# Patient Record
Sex: Female | Born: 1950 | Race: White | Hispanic: No | Smoking: Former smoker
Health system: Southern US, Community
[De-identification: ages and names within clinical notes are randomized; demographics above are authoritative.]

## PROBLEM LIST (undated history)

## (undated) DIAGNOSIS — K635 Polyp of colon: Secondary | ICD-10-CM

## (undated) DIAGNOSIS — D122 Benign neoplasm of ascending colon: Secondary | ICD-10-CM

## (undated) DIAGNOSIS — Z860100 Personal history of colon polyps, unspecified: Secondary | ICD-10-CM

## (undated) DIAGNOSIS — F419 Anxiety disorder, unspecified: Secondary | ICD-10-CM

## (undated) DIAGNOSIS — I10 Essential (primary) hypertension: Secondary | ICD-10-CM

## (undated) DIAGNOSIS — E559 Vitamin D deficiency, unspecified: Secondary | ICD-10-CM

## (undated) DIAGNOSIS — I8290 Acute embolism and thrombosis of unspecified vein: Secondary | ICD-10-CM

## (undated) DIAGNOSIS — E079 Disorder of thyroid, unspecified: Secondary | ICD-10-CM

## (undated) DIAGNOSIS — D120 Benign neoplasm of cecum: Secondary | ICD-10-CM

## (undated) DIAGNOSIS — D123 Benign neoplasm of transverse colon: Secondary | ICD-10-CM

## (undated) DIAGNOSIS — E119 Type 2 diabetes mellitus without complications: Secondary | ICD-10-CM

## (undated) DIAGNOSIS — J449 Chronic obstructive pulmonary disease, unspecified: Secondary | ICD-10-CM

## (undated) HISTORY — DX: Anxiety disorder, unspecified: F41.9

## (undated) HISTORY — PX: PHARYNGEAL DILITATION: SHX2230

## (undated) HISTORY — DX: Essential (primary) hypertension: I10

## (undated) HISTORY — DX: Disorder of thyroid, unspecified: E07.9

## (undated) HISTORY — DX: Benign neoplasm of cecum: D12.0

## (undated) HISTORY — DX: Acute embolism and thrombosis of unspecified vein: I82.90

## (undated) HISTORY — DX: Polyp of colon: K63.5

## (undated) HISTORY — DX: Vitamin D deficiency, unspecified: E55.9

## (undated) HISTORY — DX: Personal history of colon polyps, unspecified: Z86.0100

## (undated) HISTORY — DX: Benign neoplasm of transverse colon: D12.3

## (undated) HISTORY — PX: PORTACATH PLACEMENT: SHX2246

## (undated) HISTORY — DX: Benign neoplasm of ascending colon: D12.2

## (undated) HISTORY — PX: TUMOR REMOVAL: SHX12

## (undated) HISTORY — DX: Type 2 diabetes mellitus without complications: E11.9

## (undated) HISTORY — PX: INSERT / REPLACE / REMOVE PACEMAKER: SUR710

## (undated) HISTORY — DX: Personal history of colonic polyps: Z86.010

## (undated) HISTORY — PX: PORT-A-CATH REMOVAL: SHX5289

## (undated) HISTORY — PX: THYROIDECTOMY: SHX17

## (undated) HISTORY — PX: ABDOMINAL HYSTERECTOMY: SHX81

## (undated) HISTORY — PX: ESOPHAGOSCOPY WITH DILITATION: SHX5618

---

## 1988-01-29 HISTORY — PX: SPINE SURGERY: SHX786

## 1992-01-29 HISTORY — PX: PACEMAKER INSERTION: SHX728

## 2003-05-04 ENCOUNTER — Other Ambulatory Visit: Payer: Self-pay

## 2003-09-13 ENCOUNTER — Other Ambulatory Visit: Payer: Self-pay

## 2003-11-23 ENCOUNTER — Ambulatory Visit: Payer: Self-pay | Admitting: Otolaryngology

## 2005-07-04 ENCOUNTER — Ambulatory Visit: Payer: Self-pay | Admitting: Otolaryngology

## 2005-07-19 ENCOUNTER — Ambulatory Visit: Payer: Self-pay | Admitting: Otolaryngology

## 2005-07-24 ENCOUNTER — Ambulatory Visit: Payer: Self-pay | Admitting: Otolaryngology

## 2006-07-04 ENCOUNTER — Ambulatory Visit: Payer: Self-pay | Admitting: Otolaryngology

## 2006-07-09 ENCOUNTER — Ambulatory Visit: Payer: Self-pay | Admitting: Otolaryngology

## 2006-07-09 ENCOUNTER — Other Ambulatory Visit: Payer: Self-pay

## 2006-07-16 ENCOUNTER — Ambulatory Visit: Payer: Self-pay | Admitting: Otolaryngology

## 2009-02-03 ENCOUNTER — Ambulatory Visit: Payer: Self-pay | Admitting: Otolaryngology

## 2009-02-20 ENCOUNTER — Ambulatory Visit: Payer: Self-pay | Admitting: Otolaryngology

## 2009-06-06 ENCOUNTER — Ambulatory Visit: Payer: Self-pay | Admitting: Otolaryngology

## 2009-06-21 ENCOUNTER — Ambulatory Visit: Payer: Self-pay | Admitting: Otolaryngology

## 2009-06-27 ENCOUNTER — Inpatient Hospital Stay: Payer: Self-pay | Admitting: Otolaryngology

## 2010-09-03 ENCOUNTER — Emergency Department: Payer: Self-pay | Admitting: *Deleted

## 2010-10-11 ENCOUNTER — Ambulatory Visit: Payer: Self-pay | Admitting: Otolaryngology

## 2010-10-15 ENCOUNTER — Ambulatory Visit: Payer: Self-pay | Admitting: Otolaryngology

## 2011-09-05 ENCOUNTER — Ambulatory Visit: Payer: Self-pay | Admitting: Otolaryngology

## 2011-09-05 LAB — PROTIME-INR: Prothrombin Time: 20.4 secs — ABNORMAL HIGH (ref 11.5–14.7)

## 2011-09-16 ENCOUNTER — Ambulatory Visit: Payer: Self-pay | Admitting: Otolaryngology

## 2011-09-16 LAB — BASIC METABOLIC PANEL
Anion Gap: 5 — ABNORMAL LOW (ref 7–16)
Calcium, Total: 8.7 mg/dL (ref 8.5–10.1)
Chloride: 104 mmol/L (ref 98–107)
Co2: 32 mmol/L (ref 21–32)
Creatinine: 0.91 mg/dL (ref 0.60–1.30)
EGFR (African American): >60
Glucose: 116 mg/dL — ABNORMAL HIGH (ref 65–99)

## 2012-01-29 HISTORY — PX: TRACHEOSTOMY: SUR1362

## 2012-06-23 ENCOUNTER — Ambulatory Visit: Payer: Self-pay | Admitting: Otolaryngology

## 2012-06-23 LAB — BASIC METABOLIC PANEL
Anion Gap: 3 — ABNORMAL LOW (ref 7–16)
Calcium, Total: 9.3 mg/dL (ref 8.5–10.1)
Chloride: 103 mmol/L (ref 98–107)
Co2: 31 mmol/L (ref 21–32)
Creatinine: 0.91 mg/dL (ref 0.60–1.30)
EGFR (Non-African Amer.): >60
Osmolality: 279 (ref 275–301)
Potassium: 4 mmol/L (ref 3.5–5.1)

## 2012-06-23 LAB — CBC
HCT: 34.1 % — ABNORMAL LOW (ref 35.0–47.0)
MCH: 26 pg (ref 26.0–34.0)
MCHC: 32.9 g/dL (ref 32.0–36.0)
MCV: 79 fL — ABNORMAL LOW (ref 80–100)
RBC: 4.31 10*6/uL (ref 3.80–5.20)
RDW: 16.5 % — ABNORMAL HIGH (ref 11.5–14.5)

## 2012-06-23 LAB — PROTIME-INR: INR: 1.3

## 2012-06-29 ENCOUNTER — Ambulatory Visit: Payer: Self-pay | Admitting: Otolaryngology

## 2013-01-28 HISTORY — PX: COLONOSCOPY: SHX174

## 2013-11-29 ENCOUNTER — Ambulatory Visit: Payer: Self-pay | Admitting: Otolaryngology

## 2013-11-29 DIAGNOSIS — I10 Essential (primary) hypertension: Secondary | ICD-10-CM

## 2013-11-29 LAB — BASIC METABOLIC PANEL
Anion Gap: 11 (ref 7–16)
BUN: 15 mg/dL (ref 7–18)
CHLORIDE: 102 mmol/L (ref 98–107)
CREATININE: 0.95 mg/dL (ref 0.60–1.30)
Calcium, Total: 8.6 mg/dL (ref 8.5–10.1)
Co2: 27 mmol/L (ref 21–32)
EGFR (African American): >60
Glucose: 320 mg/dL — ABNORMAL HIGH (ref 65–99)
Osmolality: 293 (ref 275–301)
POTASSIUM: 3.7 mmol/L (ref 3.5–5.1)
Sodium: 140 mmol/L (ref 136–145)

## 2013-11-29 LAB — CBC WITH DIFFERENTIAL/PLATELET
BASOS PCT: 0.7 %
Basophil #: 0.1 10*3/uL (ref 0.0–0.1)
EOS ABS: 0.4 10*3/uL (ref 0.0–0.7)
Eosinophil %: 4.1 %
HCT: 41.9 % (ref 35.0–47.0)
HGB: 13.7 g/dL (ref 12.0–16.0)
LYMPHS ABS: 3.7 10*3/uL — AB (ref 1.0–3.6)
Lymphocyte %: 38.8 %
MCH: 28.8 pg (ref 26.0–34.0)
MCHC: 32.8 g/dL (ref 32.0–36.0)
MCV: 88 fL (ref 80–100)
MONO ABS: 0.8 x10 3/mm (ref 0.2–0.9)
MONOS PCT: 8.7 %
NEUTROS ABS: 4.5 10*3/uL (ref 1.4–6.5)
Neutrophil %: 47.7 %
Platelet: 208 10*3/uL (ref 150–440)
RBC: 4.77 10*6/uL (ref 3.80–5.20)
RDW: 16.8 % — ABNORMAL HIGH (ref 11.5–14.5)
WBC: 9.5 10*3/uL (ref 3.6–11.0)

## 2013-12-13 ENCOUNTER — Ambulatory Visit: Payer: Self-pay | Admitting: Otolaryngology

## 2014-05-17 NOTE — Op Note (Signed)
PATIENT NAME:  Jessica Fisher, CROSWELL MR#:  [id number] DATE OF BIRTH:  1950/04/24  DATE OF PROCEDURE:  09/16/2011  PREOPERATIVE DIAGNOSIS: Esophageal stricture.   POSTOPERATIVE DIAGNOSIS: Esophageal stricture.   PROCEDURE: Esophagoscopy and esophageal dilation.   SURGEON: Margaretha Sheffield, M.D.   ANESTHESIA: General.   COMPLICATIONS: None.   TOTAL ESTIMATED BLOOD LOSS: None.   DESCRIPTION OF PROCEDURE: The patient was given general anesthesia by oral endotracheal intubation. An upper tooth guard was placed. A pediatric esophagoscope was used for visualization of the cervical esophagus. It was pretty tight right at the opening of the cervical esophagus. I was able to slowly push the pediatric scope through. Mucosa here is healthy. The more distal esophagus beyond this has healthy mucosa as well. There is no sign of any growths or lesions here; it is just very tight at the cervical esophagus. The scope was passed down further to show the rest of the esophagus. I could not get it all the way to the GE junction because of her size and positioning. The scope was removed and no further lesions or problems were noted other than it being tight at the cervical esophagus.   Bougie dilators were used starting with a 26 [place]. This passed easily into the esophagus after removing the pediatric scope. I went up by size and dilated her to 40. At this point it was getting tighter. I was able to dilate her all the way to 70 [place]. There was no bleeding noted at all on the dilators. The patient tolerated the procedure well. She was awakened and taken to the recovery room in satisfactory condition. There were no operative complications.  ____________________________ Huey Romans, MD phj:bjt D:  09/16/2011 08:03:42 ET         T: 09/16/2011 09:45:05 ET        JOB#: [id number] Huey Romans MD ELECTRONICALLY SIGNED 09/19/2011 8:28

## 2014-05-20 NOTE — Op Note (Signed)
PATIENT NAME:  NGINA, ROYER MR#:  [id number] DATE OF BIRTH:  October 14, 1950  DATE OF PROCEDURE:  06/29/2012  PREOPERATIVE DIAGNOSIS: Esophageal stricture.  POSTOPERATIVE DIAGNOSIS: Esophageal stricture.   OPERATIVE PROCEDURE: Esophagoscopy and esophageal dilation.   SURGEON: Huey Romans, M.D.   ANESTHESIA: General.   COMPLICATIONS: None.   DESCRIPTION OF PROCEDURE: The patient was given general anesthesia by oral endotracheal intubation. Once she was fully asleep and relaxed, a pediatric esophagoscope was used to visualize the hypopharynx area. The scope was placed into the posterior pharynx behind the larynx area. There was very small opening into the esophagus. I could but just barely slide the pediatric scope through into the upper esophagus. There was no sign of granulation tissue or masses here and the mucosa appeared to be healthy. The scope was removed and a Maloney Mercury bougie esophageal dilator was used for dilating the esophagus. I started with a 30-French as this was about the diameter of the pediatric esophagoscope. These were tapered bougies and a 30-French was placed without difficulty. I then went up 34, 38 and 42. It started becoming much tighter here at 42 and so we went up slowly and went to 44, 46, 48, 50, 52, 54, 56, 58 and then 60. There was no blood noted throughout. It got a little bit tighter as we went. I would put the bougie in and let it sit for 30 seconds or so to make sure it had stretched the muscle. Once I completed this, I then used a rigid adult esophagoscope and was able to get into the esophagus. There was no sign of any tears. This was much more open and is not nearly as tight as what it was. The patient tolerated the procedure well. She was awakened and taken to the recovery room in satisfactory condition. There were no operative complications.  ____________________________ Huey Romans, MD phj:aw D: 06/29/2012 08:04:28 ET T: 06/29/2012 09:32:10  ET JOB#: [id number]  cc: Huey Romans, MD, <Dictator> Huey Romans MD ELECTRONICALLY SIGNED 07/06/2012 9:39

## 2014-05-21 NOTE — Op Note (Signed)
PATIENT NAME:  Jessica Fisher, Jessica Fisher MR#:  [id number] DATE OF BIRTH:  [birth year]  DATE OF PROCEDURE:  12/13/2013  PREOPERATIVE DIAGNOSES:  1.  Dysphagia.  2.  Esophageal stricture.   POSTOPERATIVE DIAGNOSES:  1.  Dysphagia.  2.  Esophageal stricture.   PROCEDURE: Esophagoscopy and esophageal dilation.   SURGEON: Margaretha Sheffield, MD  ANESTHESIA: General.   COMPLICATIONS: None.   DESCRIPTION OF PROCEDURE: The patient was given general anesthesia by oral endotracheal intubation. A pediatric esophagoscope was used to visualize the cervical esophagus. It was passed under direct vision. There was a real tight area here and seemed to be almost like a small ridge or stricture on the posterior wall. I was able to finally pass through into the esophagus. Mucosa was very healthy. There was no sign of granulation or ulcers or lesions anywhere. I passed this down to about 30 cm, and that was as far as I could go. There was no mucosal irritation, no sign of any fluid collecting anywhere.   Bougies were then used for dilating the esophagus. I started at 36-French and went all the way up to 60-French. I was able to pass this down to 50 cm and allowed it to sit for awhile, each one in succession. There was no blood return on the dilators at all. The patient tolerated the procedure well. This was done in the operating room. There were no operative complications. She was awakened and taken to the recovery room in satisfactory condition.   ____________________________ Huey Romans, MD phj:sb D: 12/13/2013 07:53:37 ET T: 12/13/2013 08:16:25 ET JOB#: [id number]  cc: Huey Romans, MD, <Dictator> Huey Romans MD ELECTRONICALLY SIGNED 12/13/2013 8:40

## 2014-07-26 ENCOUNTER — Encounter: Payer: Self-pay | Admitting: Podiatry

## 2014-07-26 ENCOUNTER — Ambulatory Visit (INDEPENDENT_AMBULATORY_CARE_PROVIDER_SITE_OTHER): Payer: Medicare Other | Admitting: Podiatry

## 2014-07-26 VITALS — BP 129/83 | HR 86 | Ht 67.0 in | Wt 210.0 lb

## 2014-07-26 DIAGNOSIS — L600 Ingrowing nail: Secondary | ICD-10-CM | POA: Insufficient documentation

## 2014-07-26 DIAGNOSIS — IMO0002 Reserved for concepts with insufficient information to code with codable children: Secondary | ICD-10-CM | POA: Insufficient documentation

## 2014-07-26 DIAGNOSIS — L03039 Cellulitis of unspecified toe: Secondary | ICD-10-CM | POA: Diagnosis not present

## 2014-07-26 DIAGNOSIS — L03012 Cellulitis of left finger: Secondary | ICD-10-CM

## 2014-07-26 HISTORY — DX: Reserved for concepts with insufficient information to code with codable children: IMO0002

## 2014-07-26 HISTORY — DX: Ingrowing nail: L60.0

## 2014-07-26 NOTE — Progress Notes (Signed)
Subjective: 64 year old female presents with painful ingrown nail left great toe medial border for over a year. Has had multiple heart problem and could not keep up with her toe issue. She has a Psychologist, forensic.   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  Neurological ROS: no TIA or stroke symptoms Dermatological ROS: Sensitive skin with inflamation at plantar posterior heel left foot. Flares up off and on.    Objective: Dermatologic: Abnormal sensitive and pink skin plantar posterior left heel. Flares up off and on. Ingrown nail with paronychia left great toe medial border duration of a year.  Vascular: All pedal pulses are palpable. No abnormal edema noted. Positive of erythematous left great toe from ingrown nail.  Orthopedic: No gross deformities noted.  Assessment: Ingrown nail left great toe medial border. Abnormal skin condition left heel.  Plan: Affected toe was anesthetized with total 7ml mixture of 50/50 0.5% Marcaine plain and 1% Xylocaine plain. After left great toe was cleansed with Iodine, affected nail border was reflected with a nail elevator and excised with nail nipper. The wound was dressed with Amerigel ointment dressing. Home care instructions and supply dispensed.  Return in 2 month for permanent procedure.

## 2014-07-26 NOTE — Patient Instructions (Signed)
Left great toe ingrown nail surgery done(temporary). Follow soaking instruction. Return if redness or drainage increase. Otherwise return in 2 month for permanent procedure.

## 2014-07-29 ENCOUNTER — Telehealth: Payer: Self-pay | Admitting: *Deleted

## 2014-07-29 MED ORDER — CEPHALEXIN 500 MG PO CAPS: 500.0000 mg | ORAL_CAPSULE | Freq: Two times a day (BID) | ORAL | Status: DC

## 2014-07-29 NOTE — Telephone Encounter (Signed)
07/29/2014 Dr. Caffie Pinto, Patient called this am and said her foot is swollen and toe, she had ingrown toenail surgery on 07/26/14. Can Dr. Caffie Pinto call her a antibiotic, she thinks she was allergic to something used on her toe??? I ask her about iodine and she says she isn't allergic to that.

## 2014-10-04 ENCOUNTER — Encounter: Payer: Self-pay | Admitting: Podiatry

## 2014-10-04 ENCOUNTER — Ambulatory Visit (INDEPENDENT_AMBULATORY_CARE_PROVIDER_SITE_OTHER): Payer: Medicare Other | Admitting: Podiatry

## 2014-10-04 VITALS — Ht 67.0 in | Wt 210.0 lb

## 2014-10-04 DIAGNOSIS — L600 Ingrowing nail: Secondary | ICD-10-CM

## 2014-10-04 NOTE — Progress Notes (Signed)
Patient came in to have ingrown nail surgery on left great toe. The left great toe is clean and healed well from previous temporary procedure. Advised to wait till the nail grows out before having the permanent procedure done

## 2014-11-21 ENCOUNTER — Telehealth: Payer: Self-pay | Admitting: General Surgery

## 2014-11-21 NOTE — Telephone Encounter (Signed)
Jessica Fisher FROM DR Ernesto Rutherford OFFICE CALLED & WANTED TO KNOW IF YOU WOULD DO  A  PORTA CATH PLACEMENT.HE IS PLANNING TO DO SX ON 11-14 OR 11-21 IF POSSIBLE. IF NOT HE WANTED TO USE HIS OR TIME BECAUSE HE IS GOING TO ADMIT PT AFTERWARDS.HIS SX DAY IS ON Monday'S. HER CARDIOLOGIST(DR Lyanne Co GANDHI IN [place] @ [facility] WANTED THE PORT PLACEMENT. Jessica Fisher IS WORKING ON GETTING ORDER. WILL THIS WORK FOR YOU? DO YOU HAVE ANOTHER DAY IN MIND? NOT ABLE TO ASSIST?

## 2014-11-21 NOTE — Telephone Encounter (Signed)
We can work on doing when Dr. Elenore Paddy is in the OR, but will need to see patient and have records to explain why it is being requested.

## 2014-11-30 ENCOUNTER — Encounter: Payer: Self-pay | Admitting: *Deleted

## 2014-12-05 NOTE — Telephone Encounter (Signed)
12-05-14 ABBY WITH DR JUEGEL'S [facility] HAS BEEN WORKING WITH REBCEA & MYSELF ON THIS PT.THE PT IS NOT ABLE TO GET AN ORDER FOR A PORT PLACEMENT FROM EITHER HER CARDIOLOGIST OR HER PCP.WHILE I WAS OUT SICK REBECA MADE PT AN APPT TO COME IN ON Tuesday 12-06-14/[facility]

## 2014-12-06 ENCOUNTER — Other Ambulatory Visit: Payer: Medicare Other

## 2014-12-06 ENCOUNTER — Encounter: Payer: Self-pay | Admitting: General Surgery

## 2014-12-06 ENCOUNTER — Ambulatory Visit (INDEPENDENT_AMBULATORY_CARE_PROVIDER_SITE_OTHER): Payer: Medicare Other | Admitting: General Surgery

## 2014-12-06 VITALS — BP 116/70 | HR 74 | Resp 14 | Ht 67.0 in | Wt 206.0 lb

## 2014-12-06 DIAGNOSIS — E079 Disorder of thyroid, unspecified: Secondary | ICD-10-CM | POA: Diagnosis not present

## 2014-12-06 DIAGNOSIS — I878 Other specified disorders of veins: Secondary | ICD-10-CM | POA: Diagnosis not present

## 2014-12-06 DIAGNOSIS — E039 Hypothyroidism, unspecified: Secondary | ICD-10-CM

## 2014-12-06 NOTE — Patient Instructions (Signed)
Follow up appointment to be announced.  

## 2014-12-06 NOTE — Progress Notes (Signed)
Patient ID: Jessica Fisher, female   DOB: 25-Aug-1950, 64 y.o.   MRN: [id number]  Chief Complaint  Patient presents with  . Other    port placement    HPI Jessica Fisher is a 64 y.o. female. here today for a evaluation of a port placement.  The patient has received most of her care outside this [place]. Her ENT, locally based is planning on an esophageal dilatation. She has had difficulty with intermittent dysphagia dating back to the time of her tracheostomy. One of her other physicians at [facility] has suggested that she have a port placed as she has poor venous access and requires every other month laboratory studies and has had hospitalizations were venous access was a problem.  The patient has remarkably survived life-threatening illnesses including long-term ventilator support. 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 All the records from her past hospitalizations or not available, nor necessary.  A note from her cardiologist clearing her for Port-A-Cath placement but offering no explanation or confirmation of his recommendation that the procedure be completed was reviewed dated 11/25/2014.                                 HPI  Past Medical History  Diagnosis Date  . Diabetes mellitus without complication ([place])   . Anxiety   . Thyroid disease   . Vitamin D deficiency   . Blood clot in vein   . Hypertension     Past Surgical History  Procedure Laterality Date  . Cesarean section    . Abdominal hysterectomy    . Thyroidectomy    . Colonoscopy  2015  . Pharyngeal dilitation    . Portacath placement    . Port-a-cath removal    . Esophagoscopy with dilitation    . Pacemaker insertion  1994  . Tracheostomy  2014  . Tumor removal    . Spine surgery  1990    No family history on file.  Social History Social History  Substance Use Topics  . Smoking status: Former Smoker -- 1.00 packs/day for 26 years    Types: Cigarettes  .  Smokeless tobacco: Never Used  . Alcohol Use: No    Allergies  Allergen Reactions  . Metformin And Related   . Metronidazole   . Pentazocine   . Clarithromycin Itching  . Sulfa Antibiotics Itching    Current Outpatient Prescriptions  Medication Sig Dispense Refill  . ALPRAZolam (XANAX) 1 MG tablet     . canagliflozin (INVOKANA) 300 MG TABS tablet Take 300 mg by mouth.    . cloNIDine (CATAPRES) 0.1 MG tablet Take 0.1 mg by mouth 2 (two) times daily.    . diclofenac (FLECTOR) 1.3 % PTCH     . digoxin (LANOXIN) 0.25 MG tablet Take by mouth.    . diltiazem (CARDIZEM CD) 300 MG 24 hr capsule     . doxepin (SINEQUAN) 10 MG capsule     . fluconazole (DIFLUCAN) 150 MG tablet     . furosemide (LASIX) 40 MG tablet     . HYDROmorphone (DILAUDID) 4 MG tablet     . Insulin Pen Needle (EASY TOUCH PEN NEEDLES) 31G X 8 MM MISC     . insulin regular human CONCENTRATED (HUMULIN R) 500 UNIT/ML injection     . Insulin Syringe-Needle U-100 (BD INSULIN SYRINGE ULTRAFINE) 31G X 5/16" 1 ML  MISC     . levothyroxine (SYNTHROID, LEVOTHROID) 200 MCG tablet Take by mouth.    . Liraglutide (VICTOZA) 18 MG/3ML SOPN     . lisinopril (PRINIVIL,ZESTRIL) 10 MG tablet Take 10 mg by mouth daily.    . metoCLOPramide (REGLAN) 10 MG tablet Take 10 mg by mouth.    . metolazone (ZAROXOLYN) 5 MG tablet Take 5 mg by mouth.    . nitroGLYCERIN (NITROSTAT) 0.4 MG SL tablet     . omeprazole (PRILOSEC) 40 MG capsule Take 40 mg by mouth.    . oxyCODONE-acetaminophen (PERCOCET/ROXICET) 5-325 MG per tablet     . Oxymorphone HCl, Crush Resist, (OPANA ER, CRUSH RESISTANT,) 40 MG T12A     . pregabalin (LYRICA) 300 MG capsule Take 300 mg by mouth.    . QUEtiapine (SEROQUEL) 100 MG tablet     . rivaroxaban (XARELTO) 20 MG TABS tablet     . rosuvastatin (CRESTOR) 10 MG tablet Take 10 mg by mouth.    . solifenacin (VESICARE) 10 MG tablet Take 10 mg by mouth.    . Tiotropium Bromide Monohydrate (SPIRIVA RESPIMAT) 2.5 MCG/ACT AERS      . venlafaxine XR (EFFEXOR-XR) 150 MG 24 hr capsule Take 150 mg by mouth.    . cyclobenzaprine (FLEXERIL) 10 MG tablet     . lubiprostone (AMITIZA) 24 MCG capsule Take by mouth.     No current facility-administered medications for this visit.    Review of Systems Review of Systems  Constitutional: Negative.   Respiratory: Negative.   Cardiovascular: Negative.   Gastrointestinal: Negative.     Blood pressure 116/70, pulse 74, resp. rate 14, height 5\' 7"  (1.702 m), weight 206 lb (93.441 kg).  Physical Exam Physical Exam  Constitutional: She is oriented to person, place, and time. She appears well-developed and well-nourished.  Eyes: Conjunctivae are normal. No scleral icterus.  Neck: Neck supple.  Cardiovascular: Normal rate, regular rhythm and normal heart sounds.   Pulmonary/Chest: Effort normal and breath sounds normal.    Examination of the upper extremity showed no venous dilatation. Veins on the hand were fairly prominent for woman. She reports that they "blow" when used for blood draws or IVs.  Lymphadenopathy:    She has no cervical adenopathy.  Neurological: She is alert and oriented to person, place, and time.  Skin: Skin is warm and dry.    Data Reviewed Cardiology clearance dated 11/30/2014 from Dr. Towanda Malkin reviewed. Should felt to be at low risk.  Multiple laboratory studies from September 2016 reviewed. Drug screen positive for prescribed benzodiazepine. Hydromorphone present. Oxymorphone present. Normal electrolytes, normal liver function studies, low vitamin D, hemoglobin 14.8 with an MCV of 93, white blood cell count 9100.  [facility] 11/16/2014 showed sinus rhythm with left axis deviation. No interval change from October 2016. Artery algae note from Dr. Duke Salvia dated 09/13/2014 reported normal coronary arteries on May 2015 exam. Past history pulmonary emboli, negative chest CT August 2011 and 2013. Previous cardiac monitoring May 2016 did not  show significant arrhythmias. Status post dual-chamber pacemaker placement April 2016.  Venous ultrasound of the upper extremity veins was undertaken in the event that placement of a Port-A-Cath is appropriate. The right subclavian vein is in use with the previously placed pacemaker. The left subclavian vein show significant stenosis likely secondary to the previous port placement. The left internal jugular vein shows clot and is noncompressible. No flow noted. The right internal jugular vein is patent, shows normal respiratory variation  and compressibility.  Assessment    Poor venous access, possible candidate for chronic central venous access.    Plan    This patient has a long complicated medical history. It is uncommon to require a port placement for central venous access for routine laboratory studies. Much safer would be intermittent sampling from either an artery or the femoral vein. If her internist or cardiologist submittal lead her stating that the risk of central venous access and the addition of another intravascular device in the face of her previous deep venous thrombosis/pulmonary embolus is warranted, this can be placed. With occlusion of the left internal jugular vein should she developed right IJ occlusion she could be at risk for significant increased intracranial venous pressure.  I have not strongly supported placement of catheter, but will defer to her primary care/cardiology physicians as they certainly no this patient better. The internal jugular vein could always be used in an emergency situation, and would avoid the risk of long-term cannulation.  The patient is scheduled to be reevaluated by her cardiologist later this week. A copy of today's note will be forwarded to him for his review. Final decision on port placement will be made after his reassessment.     Over 45 minutes was spent reviewing the patient's extensive medical history and reviewing the pros and cons of  central venous access. PCP:  Dustin Folks 12/07/2014, 6:52 AM

## 2014-12-07 DIAGNOSIS — I878 Other specified disorders of veins: Secondary | ICD-10-CM

## 2014-12-07 DIAGNOSIS — E079 Disorder of thyroid, unspecified: Secondary | ICD-10-CM | POA: Insufficient documentation

## 2014-12-07 HISTORY — DX: Other specified disorders of veins: I87.8

## 2014-12-08 ENCOUNTER — Encounter
Admission: RE | Admit: 2014-12-08 | Discharge: 2014-12-08 | Disposition: A | Discharge status: Home / Self Care | Payer: Medicare Other | Source: Ambulatory Visit | Attending: Otolaryngology | Admitting: Otolaryngology

## 2014-12-08 DIAGNOSIS — Z01818 Encounter for other preprocedural examination: Secondary | ICD-10-CM | POA: Diagnosis present

## 2014-12-08 LAB — SURGICAL PCR SCREEN
MRSA, PCR: POSITIVE — AB
STAPHYLOCOCCUS AUREUS: POSITIVE — AB

## 2014-12-08 NOTE — OR Nursing (Signed)
Faxed notification of MRSA + to Dr. Kathyrn Sheriff and Dr. Dwyane Luo offices

## 2014-12-08 NOTE — Patient Instructions (Signed)
  Your procedure is scheduled on: 12/12/14 Report to Day Surgery. To find out your arrival time please call [phone] between 1PM - 3PM on 12/09/14  Remember: Instructions that are not followed completely may result in serious medical risk, up to and including death, or upon the discretion of your surgeon and anesthesiologist your surgery may need to be rescheduled.    ____ 1. Do not eat food or drink liquids after midnight. No gum chewing or hard candies.     ____ 2. No Alcohol for 24 hours before or after surgery.   ____ 3. Bring all medications with you on the day of surgery if instructed.    ____ 4. Notify your doctor if there is any change in your medical condition     (cold, fever, infections).     Do not wear jewelry, make-up, hairpins, clips or nail polish.  Do not wear lotions, powders, or perfumes. You may wear deodorant.  Do not shave 48 hours prior to surgery. Men may shave face and neck.  Do not bring valuables to the hospital.    [facility] is not responsible for any belongings or valuables.               Contacts, dentures or bridgework may not be worn into surgery.  Leave your suitcase in the car. After surgery it may be brought to your room.  For patients admitted to the hospital, discharge time is determined by your                treatment team.   Patients discharged the day of surgery will not be allowed to drive home.   Please read over the following fact sheets that you were given:   Surgical Site Infection Prevention   __x__ Take these medicines the morning of surgery with A SIP OF WATER:    1. Pain Medicine  2. Alprazolam  3. clonidine  4.lanoxin                Cardizem   5.levothyroxine  6.Lisinopril               omeprazole  ____ Fleet Enema (as directed)   __x__ Use CHG Soap as directed  __x__ Use inhalers on the day of surgery Bring with you to hospital  ____ Stop metformin 2 days prior to surgery    __x__ Take 1/2 of usual insulin  dose the night before surgery and none on the morning of surgery.   __x__ Stop Coumadin/Plavix/aspirin on xerelto stopped on 12/05/14  ___x_ Stop Anti-inflammatories on No patches until after surgery   ____ Stop supplements until after surgery.    ____ Bring C-Pap to the hospital.

## 2014-12-12 ENCOUNTER — Ambulatory Visit: Payer: Medicare Other | Admitting: Certified Registered Nurse Anesthetist

## 2014-12-12 ENCOUNTER — Encounter: Payer: Self-pay | Admitting: Anesthesiology

## 2014-12-12 ENCOUNTER — Ambulatory Visit
Admission: RE | Admit: 2014-12-12 | Discharge: 2014-12-12 | Disposition: A | Discharge status: Home / Self Care | Payer: Medicare Other | Source: Ambulatory Visit | Attending: Otolaryngology | Admitting: Otolaryngology

## 2014-12-12 ENCOUNTER — Encounter
Admission: RE | Disposition: A | Discharge status: Home / Self Care | Payer: Self-pay | Source: Ambulatory Visit | Attending: Otolaryngology

## 2014-12-12 DIAGNOSIS — Z981 Arthrodesis status: Secondary | ICD-10-CM | POA: Diagnosis not present

## 2014-12-12 DIAGNOSIS — J449 Chronic obstructive pulmonary disease, unspecified: Secondary | ICD-10-CM | POA: Insufficient documentation

## 2014-12-12 DIAGNOSIS — K6389 Other specified diseases of intestine: Secondary | ICD-10-CM | POA: Insufficient documentation

## 2014-12-12 DIAGNOSIS — Z9849 Cataract extraction status, unspecified eye: Secondary | ICD-10-CM | POA: Diagnosis not present

## 2014-12-12 DIAGNOSIS — K219 Gastro-esophageal reflux disease without esophagitis: Secondary | ICD-10-CM | POA: Insufficient documentation

## 2014-12-12 DIAGNOSIS — Z888 Allergy status to other drugs, medicaments and biological substances status: Secondary | ICD-10-CM | POA: Insufficient documentation

## 2014-12-12 DIAGNOSIS — Z79891 Long term (current) use of opiate analgesic: Secondary | ICD-10-CM | POA: Insufficient documentation

## 2014-12-12 DIAGNOSIS — Z7901 Long term (current) use of anticoagulants: Secondary | ICD-10-CM | POA: Insufficient documentation

## 2014-12-12 DIAGNOSIS — Z881 Allergy status to other antibiotic agents status: Secondary | ICD-10-CM | POA: Diagnosis not present

## 2014-12-12 DIAGNOSIS — Z8673 Personal history of transient ischemic attack (TIA), and cerebral infarction without residual deficits: Secondary | ICD-10-CM | POA: Diagnosis not present

## 2014-12-12 DIAGNOSIS — Z820 Family history of epilepsy and other diseases of the nervous system: Secondary | ICD-10-CM | POA: Diagnosis not present

## 2014-12-12 DIAGNOSIS — F329 Major depressive disorder, single episode, unspecified: Secondary | ICD-10-CM | POA: Diagnosis not present

## 2014-12-12 DIAGNOSIS — Z950 Presence of cardiac pacemaker: Secondary | ICD-10-CM | POA: Diagnosis not present

## 2014-12-12 DIAGNOSIS — G2581 Restless legs syndrome: Secondary | ICD-10-CM | POA: Insufficient documentation

## 2014-12-12 DIAGNOSIS — Z883 Allergy status to other anti-infective agents status: Secondary | ICD-10-CM | POA: Insufficient documentation

## 2014-12-12 DIAGNOSIS — E669 Obesity, unspecified: Secondary | ICD-10-CM | POA: Diagnosis not present

## 2014-12-12 DIAGNOSIS — K222 Esophageal obstruction: Secondary | ICD-10-CM | POA: Insufficient documentation

## 2014-12-12 DIAGNOSIS — Z91048 Other nonmedicinal substance allergy status: Secondary | ICD-10-CM | POA: Insufficient documentation

## 2014-12-12 DIAGNOSIS — Z87891 Personal history of nicotine dependence: Secondary | ICD-10-CM | POA: Insufficient documentation

## 2014-12-12 DIAGNOSIS — I119 Hypertensive heart disease without heart failure: Secondary | ICD-10-CM | POA: Diagnosis not present

## 2014-12-12 DIAGNOSIS — E890 Postprocedural hypothyroidism: Secondary | ICD-10-CM | POA: Insufficient documentation

## 2014-12-12 DIAGNOSIS — J8410 Pulmonary fibrosis, unspecified: Secondary | ICD-10-CM | POA: Diagnosis not present

## 2014-12-12 DIAGNOSIS — J45909 Unspecified asthma, uncomplicated: Secondary | ICD-10-CM | POA: Insufficient documentation

## 2014-12-12 DIAGNOSIS — E785 Hyperlipidemia, unspecified: Secondary | ICD-10-CM | POA: Diagnosis not present

## 2014-12-12 DIAGNOSIS — Z823 Family history of stroke: Secondary | ICD-10-CM | POA: Diagnosis not present

## 2014-12-12 DIAGNOSIS — Z8249 Family history of ischemic heart disease and other diseases of the circulatory system: Secondary | ICD-10-CM | POA: Insufficient documentation

## 2014-12-12 DIAGNOSIS — I341 Nonrheumatic mitral (valve) prolapse: Secondary | ICD-10-CM | POA: Diagnosis not present

## 2014-12-12 DIAGNOSIS — I4891 Unspecified atrial fibrillation: Secondary | ICD-10-CM | POA: Insufficient documentation

## 2014-12-12 DIAGNOSIS — Z79899 Other long term (current) drug therapy: Secondary | ICD-10-CM | POA: Insufficient documentation

## 2014-12-12 DIAGNOSIS — Z6832 Body mass index (BMI) 32.0-32.9, adult: Secondary | ICD-10-CM | POA: Diagnosis not present

## 2014-12-12 DIAGNOSIS — I2510 Atherosclerotic heart disease of native coronary artery without angina pectoris: Secondary | ICD-10-CM | POA: Diagnosis not present

## 2014-12-12 HISTORY — DX: Chronic obstructive pulmonary disease, unspecified: J44.9

## 2014-12-12 HISTORY — PX: ESOPHAGOSCOPY WITH DILITATION: SHX5618

## 2014-12-12 LAB — GLUCOSE, CAPILLARY
GLUCOSE-CAPILLARY: 173 mg/dL — AB (ref 65–99)
GLUCOSE-CAPILLARY: 127 mg/dL — AB (ref 65–99)

## 2014-12-12 SURGERY — ESOPHAGOSCOPY, WITH DILATION
Anesthesia: General | Site: Throat | Wound class: Clean Contaminated

## 2014-12-12 MED ORDER — LIDOCAINE HCL (CARDIAC) 20 MG/ML IV SOLN
INTRAVENOUS | Status: DC | PRN
  Administered 2014-12-12: 100 mg via INTRAVENOUS

## 2014-12-12 MED ORDER — FENTANYL CITRATE (PF) 100 MCG/2ML IJ SOLN
25.0000 ug | INTRAMUSCULAR | Status: DC | PRN
  Administered 2014-12-12 (×2): 25 ug via INTRAVENOUS

## 2014-12-12 MED ORDER — SUCCINYLCHOLINE CHLORIDE 20 MG/ML IJ SOLN
INTRAMUSCULAR | Status: DC | PRN
  Administered 2014-12-12: 80 mg via INTRAVENOUS

## 2014-12-12 MED ORDER — ROCURONIUM BROMIDE 100 MG/10ML IV SOLN
INTRAVENOUS | Status: DC | PRN
  Administered 2014-12-12: 10 mg via INTRAVENOUS
  Administered 2014-12-12: 5 mg via INTRAVENOUS

## 2014-12-12 MED ORDER — FENTANYL CITRATE (PF) 100 MCG/2ML IJ SOLN
INTRAMUSCULAR | Status: AC
  Filled 2014-12-12: qty 2

## 2014-12-12 MED ORDER — PHENYLEPHRINE HCL 10 % OP SOLN
Freq: Once | OPHTHALMIC | Status: DC
  Filled 2014-12-12: qty 10

## 2014-12-12 MED ORDER — MIDAZOLAM HCL 2 MG/2ML IJ SOLN
INTRAMUSCULAR | Status: DC | PRN
  Administered 2014-12-12: 2 mg via INTRAVENOUS

## 2014-12-12 MED ORDER — PROPOFOL 10 MG/ML IV BOLUS
INTRAVENOUS | Status: DC | PRN
  Administered 2014-12-12: 140 mg via INTRAVENOUS
  Administered 2014-12-12: 50 mg via INTRAVENOUS
  Administered 2014-12-12: 60 mg via INTRAVENOUS

## 2014-12-12 MED ORDER — ONDANSETRON HCL 4 MG/2ML IJ SOLN
INTRAMUSCULAR | Status: DC | PRN
  Administered 2014-12-12: 4 mg via INTRAVENOUS

## 2014-12-12 MED ORDER — PHENYLEPHRINE HCL 10 MG/ML IJ SOLN
INTRAMUSCULAR | Status: DC | PRN
  Administered 2014-12-12: 50 ug via INTRAVENOUS

## 2014-12-12 MED ORDER — FENTANYL CITRATE (PF) 100 MCG/2ML IJ SOLN
INTRAMUSCULAR | Status: DC | PRN
  Administered 2014-12-12 (×4): 50 ug via INTRAVENOUS

## 2014-12-12 MED ORDER — ACETAMINOPHEN 325 MG PO TABS
ORAL_TABLET | ORAL | Status: AC
  Administered 2014-12-12: 650 mg via ORAL
  Filled 2014-12-12: qty 2

## 2014-12-12 MED ORDER — ONDANSETRON HCL 4 MG/2ML IJ SOLN: 4.0000 mg | Freq: Once | INTRAMUSCULAR | Status: DC | PRN

## 2014-12-12 MED ORDER — ACETAMINOPHEN 325 MG PO TABS
650.0000 mg | ORAL_TABLET | Freq: Once | ORAL | Status: AC
  Administered 2014-12-12: 650 mg via ORAL

## 2014-12-12 MED ORDER — SODIUM CHLORIDE 0.9 % IV SOLN
INTRAVENOUS | Status: DC
  Administered 2014-12-12: 50 mL/h via INTRAVENOUS

## 2014-12-12 SURGICAL SUPPLY — 9 items
CANISTER SUCT 1200ML W/VALVE (MISCELLANEOUS) ×3 IMPLANT
CUP MEDICINE 2OZ PLAST GRAD ST (MISCELLANEOUS) ×3 IMPLANT
DRAPE SHEET LG 3/4 BI-LAMINATE (DRAPES) ×3 IMPLANT
DRAPE TABLE BACK 80X90 (DRAPES) ×3 IMPLANT
GLOVE EXAM LX STRL 7.5 (GLOVE) ×3 IMPLANT
SURGILUBE 2OZ TUBE FLIPTOP (MISCELLANEOUS) ×3 IMPLANT
TOWEL OR 17X26 4PK STRL BLUE (TOWEL DISPOSABLE) ×3 IMPLANT
TUBING CONNECTING 10 (TUBING) ×2 IMPLANT
TUBING CONNECTING 10' (TUBING) ×1

## 2014-12-12 NOTE — Transfer of Care (Signed)
Immediate Anesthesia Transfer of Care Note  Patient: Jessica Fisher  Procedure(s) Performed: Procedure(s): ESOPHAGOSCOPY WITH DILITATION (N/A)  Patient Location: PACU  Anesthesia Type:General  Level of Consciousness: awake and alert   Airway & Oxygen Therapy: Patient Spontanous Breathing and Patient connected to face mask oxygen  Post-op Assessment: Report given to RN and Post -op Vital signs reviewed and stable  Post vital signs: Reviewed and stable  Last Vitals:  Filed Vitals:   12/12/14 1119  BP: 145/61  Pulse: 73  Temp: 36.3 C  Resp: 10    Complications: No apparent anesthesia complications

## 2014-12-12 NOTE — Op Note (Signed)
12/12/2014  11:03 AM    Jessica Fisher  RP:[id number]   Pre-Op Dx:  Cervical esophageal stricture  Post-op Dx: Same  Proc: Esophagoscopy and esophageal dilation   Surg:  Shaan Rhoads H  Anes:  GOT  EBL:  None  Comp:  None  Findings:  Tight cervical esophageal sphincter  Procedure: The patient was given general anesthesia by oral endotracheal intubation. He was in a supine position on the table with her neck extended. A tooth guard was used to protect her upper teeth and a pediatric esophagoscope was used for visualization of the cervical esophagus. The subcutaneous esophagus was tight and I was unable to get past this area. There is no evidence of lesions or masses. The mucosa looked healthy.  Maloney dilators were used for opening up the cervical esophagus. Started with a 66 [place] and was able to slide this through. We then slowly increased up to 50 [place]. There is no bleeding noted at all throughout the procedure. I then took a 10 x 14 mm x 50 cm esophagoscope to visualize the area again I could get part way through the cervical or: Esophagus with this but I did not want to push it to create any trauma. I was unable to see the length of the esophagus, but she dilated well up to 22 [place].  She tolerated this well. She was awakened taken to the recovery room in satisfactory condition. There were no operative complications.  Dispo:   To PACU to be discharged home  Plan:  Can start the soft diet and increase diet as tolerated home. Should not need any pain medication. She will call if she has any postop problems, and let me know when she starts having difficulty swallowing again.  Nicolus Ose H  12/12/2014 11:03 AM

## 2014-12-12 NOTE — Anesthesia Postprocedure Evaluation (Signed)
  Anesthesia Post-op Note  Patient: Jessica Fisher  Procedure(s) Performed: Procedure(s): ESOPHAGOSCOPY WITH DILITATION (N/A)  Anesthesia type:General  Patient location: PACU  Post pain: Pain level controlled  Post assessment: Post-op Vital signs reviewed, Patient's Cardiovascular Status Stable, Respiratory Function Stable, Patent Airway and No signs of Nausea or vomiting  Post vital signs: Reviewed and stable  Last Vitals:  Filed Vitals:   12/12/14 1304  BP: 118/64  Pulse: 71  Temp:   Resp: 16    Level of consciousness: awake, alert  and patient cooperative  Complications: No apparent anesthesia complications

## 2014-12-12 NOTE — Anesthesia Preprocedure Evaluation (Addendum)
Anesthesia Evaluation  Patient identified by MRN, date of birth, ID band Patient awake    Reviewed: Allergy & Precautions, NPO status , Patient's Chart, lab work & pertinent test results, reviewed documented beta blocker date and time   Airway Mallampati: II  TM Distance: >3 FB     Dental  (+) Chipped   Pulmonary former smoker,           Cardiovascular hypertension, Pt. on medications + pacemaker      Neuro/Psych Anxiety    GI/Hepatic   Endo/Other  diabetes, Type 2  Renal/GU      Musculoskeletal   Abdominal   Peds  Hematology   Anesthesia Other Findings Dual pacemaker. Hx of trach. Lumbar lam with metal. + MRSA. EKG ok. Does not take NTG anymore. Took it during malfunction of pacemaker. +MRSA.  Reproductive/Obstetrics                           Anesthesia Physical Anesthesia Plan  ASA: III  Anesthesia Plan: General   Post-op Pain Management:    Induction: Intravenous  Airway Management Planned: Oral ETT  Additional Equipment:   Intra-op Plan:   Post-operative Plan:   Informed Consent: I have reviewed the patients History and Physical, chart, labs and discussed the procedure including the risks, benefits and alternatives for the proposed anesthesia with the patient or authorized representative who has indicated his/her understanding and acceptance.     Plan Discussed with: CRNA  Anesthesia Plan Comments:         Anesthesia Quick Evaluation

## 2014-12-12 NOTE — H&P (Signed)
  H&P has been reviewed and no changes necessary. To be downloaded later. 

## 2015-06-30 ENCOUNTER — Encounter: Payer: Self-pay | Admitting: Podiatry

## 2015-06-30 ENCOUNTER — Ambulatory Visit (INDEPENDENT_AMBULATORY_CARE_PROVIDER_SITE_OTHER): Payer: Medicare Other | Admitting: Podiatry

## 2015-06-30 VITALS — BP 181/88 | HR 88

## 2015-06-30 DIAGNOSIS — IMO0002 Reserved for concepts with insufficient information to code with codable children: Secondary | ICD-10-CM

## 2015-06-30 DIAGNOSIS — L600 Ingrowing nail: Secondary | ICD-10-CM | POA: Diagnosis not present

## 2015-06-30 DIAGNOSIS — M79606 Pain in leg, unspecified: Secondary | ICD-10-CM

## 2015-06-30 DIAGNOSIS — L03039 Cellulitis of unspecified toe: Secondary | ICD-10-CM | POA: Diagnosis not present

## 2015-06-30 DIAGNOSIS — M79673 Pain in unspecified foot: Secondary | ICD-10-CM

## 2015-06-30 MED ORDER — AMOXICILLIN-POT CLAVULANATE 875-125 MG PO TABS: 1.0000 | ORAL_TABLET | Freq: Two times a day (BID) | ORAL | Status: DC

## 2015-06-30 NOTE — Patient Instructions (Signed)
Ingrown nail surgery done on both great toes. Follow soaking instructions and take antibiotics as prescribed. Return in one week.

## 2015-06-30 NOTE — Progress Notes (Signed)
Subjective: 65 year old female presents requesting ingrown nail suger to be done on both great toes on both borders. She was at her PCP office and was recommended to have this procedure done since she is at high risk with infection. Patient is wearing O2 tank.  Pain with ingrown nail on both great toe. Last Tuesday was unconscious for 5 hours from high blood sugar.  Patient also request for diabetic shoes.  Objective: Dermatologic:  Ingrown nail on both borders of both great toe symptomatic. Abnormal sensitive and pink skin plantar posterior left heel. Flares up off and on. Ingrown nail with paronychia left great toe medial border duration of a year.  Vascular: All pedal pulses are palpable. No abnormal edema noted. Positive of erythematous left great toe from ingrown nail.  Orthopedic: No gross deformities noted.  Assessment: Ingrown nail both borders of both great toes. Abnormal skin condition left heel, checked out by dermatology for malignancy and was negative.  Plan: Affected both great toes were anesthetized with total 102ml mixture of 50/50 0.5% Marcaine plain and 1% Xylocaine plain. After left great toe was cleansed with Iodine, affected nail border was reflected with a nail elevator and excised with nail nipper. Nail matrix tissues on both borders of both great toes were cauterized with Phenol x 3 and neutralized with alcohol.  Both great toe wounds were dressed with Amerigel ointment dressing. Home care instructions and supply dispensed.  Return in one week.

## 2015-07-05 ENCOUNTER — Encounter: Payer: Self-pay | Admitting: Podiatry

## 2015-07-05 ENCOUNTER — Ambulatory Visit (INDEPENDENT_AMBULATORY_CARE_PROVIDER_SITE_OTHER): Payer: Medicare Other | Admitting: Podiatry

## 2015-07-05 DIAGNOSIS — Z9889 Other specified postprocedural states: Secondary | ICD-10-CM

## 2015-07-05 NOTE — Progress Notes (Signed)
One week status post bilateral ingrown nail surgery both great toe both borders with Phenol and Alcohol.  She has had reaction with local anesthetics that made red streaks at the injection site and made it itch. She took Benedril and is tolerable. Been soaking both feet daily. Both great toe surgical site is clean with minimum drainage. No redness or abnormal issues.  Wound cleansed with Betadine ointment and Amerigel ointment dressing applied.  Continue to soak daily till complete healing. Return as needed.

## 2015-07-05 NOTE — Patient Instructions (Signed)
Good wound healing following ingrown nail surgery on both great toes. Continue soaking till complete healing achieved, and return as needed.

## 2017-03-25 ENCOUNTER — Other Ambulatory Visit: Payer: Self-pay

## 2017-03-25 ENCOUNTER — Emergency Department (HOSPITAL_BASED_OUTPATIENT_CLINIC_OR_DEPARTMENT_OTHER)
Admission: EM | Admit: 2017-03-25 | Discharge: 2017-03-25 | Disposition: A | Discharge status: Home / Self Care | Payer: Medicare Other | Attending: Emergency Medicine | Admitting: Emergency Medicine

## 2017-03-25 ENCOUNTER — Emergency Department (HOSPITAL_BASED_OUTPATIENT_CLINIC_OR_DEPARTMENT_OTHER): Payer: Medicare Other

## 2017-03-25 ENCOUNTER — Encounter (HOSPITAL_BASED_OUTPATIENT_CLINIC_OR_DEPARTMENT_OTHER): Payer: Self-pay | Admitting: Emergency Medicine

## 2017-03-25 DIAGNOSIS — R739 Hyperglycemia, unspecified: Secondary | ICD-10-CM

## 2017-03-25 DIAGNOSIS — E1165 Type 2 diabetes mellitus with hyperglycemia: Secondary | ICD-10-CM | POA: Diagnosis not present

## 2017-03-25 DIAGNOSIS — R079 Chest pain, unspecified: Secondary | ICD-10-CM | POA: Diagnosis present

## 2017-03-25 DIAGNOSIS — E039 Hypothyroidism, unspecified: Secondary | ICD-10-CM | POA: Insufficient documentation

## 2017-03-25 DIAGNOSIS — Z79899 Other long term (current) drug therapy: Secondary | ICD-10-CM | POA: Diagnosis not present

## 2017-03-25 DIAGNOSIS — I10 Essential (primary) hypertension: Secondary | ICD-10-CM | POA: Diagnosis not present

## 2017-03-25 DIAGNOSIS — Z7901 Long term (current) use of anticoagulants: Secondary | ICD-10-CM | POA: Diagnosis not present

## 2017-03-25 DIAGNOSIS — Z87891 Personal history of nicotine dependence: Secondary | ICD-10-CM | POA: Insufficient documentation

## 2017-03-25 DIAGNOSIS — J449 Chronic obstructive pulmonary disease, unspecified: Secondary | ICD-10-CM | POA: Diagnosis not present

## 2017-03-25 DIAGNOSIS — Z794 Long term (current) use of insulin: Secondary | ICD-10-CM | POA: Insufficient documentation

## 2017-03-25 DIAGNOSIS — R0789 Other chest pain: Secondary | ICD-10-CM | POA: Diagnosis not present

## 2017-03-25 LAB — TROPONIN I

## 2017-03-25 LAB — BASIC METABOLIC PANEL
ANION GAP: 11 (ref 5–15)
BUN: 20 mg/dL (ref 6–20)
CO2: 24 mmol/L (ref 22–32)
Calcium: 9.6 mg/dL (ref 8.9–10.3)
Chloride: 95 mmol/L — ABNORMAL LOW (ref 101–111)
Creatinine, Ser: 0.89 mg/dL (ref 0.44–1.00)
GLUCOSE: 413 mg/dL — AB (ref 65–99)
Potassium: 4.8 mmol/L (ref 3.5–5.1)
Sodium: 130 mmol/L — ABNORMAL LOW (ref 135–145)

## 2017-03-25 LAB — CBC
HEMATOCRIT: 46 % (ref 36.0–46.0)
HEMOGLOBIN: 16.1 g/dL — AB (ref 12.0–15.0)
MCH: 30.1 pg (ref 26.0–34.0)
MCHC: 35 g/dL (ref 30.0–36.0)
MCV: 86.1 fL (ref 78.0–100.0)
Platelets: 259 10*3/uL (ref 150–400)
RBC: 5.34 MIL/uL — AB (ref 3.87–5.11)
RDW: 15.1 % (ref 11.5–15.5)
WBC: 9.2 10*3/uL (ref 4.0–10.5)

## 2017-03-25 LAB — CBG MONITORING, ED: Glucose-Capillary: 285 mg/dL — ABNORMAL HIGH (ref 65–99)

## 2017-03-25 MED ORDER — INSULIN ASPART 100 UNIT/ML ~~LOC~~ SOLN
5.0000 [IU] | Freq: Once | SUBCUTANEOUS | Status: AC
  Administered 2017-03-25: 5 [IU] via SUBCUTANEOUS
  Filled 2017-03-25: qty 1

## 2017-03-25 NOTE — ED Triage Notes (Signed)
Patient has not stopped talking the whole time in triage  - the patient is yelling loudly in no note distress. She reports that is anxious and upset about the MD's that have seen her and refused to see her lately

## 2017-03-25 NOTE — ED Notes (Signed)
Patient states that she would like to eat but then subsequently states that she needs to vomit and hasn't eaten all day.  Son states that she drank two meal replacement shakes today.  Informed patient that if she is vomiting she should not eat right now.  Patient states "I can eat a saltine cracker".  Patient instructed to not eat or drink at present until seen by EDP.  Patient yelling when speaking to staff.  Currently with mountain dew at the bedside.

## 2017-03-25 NOTE — ED Notes (Signed)
Patient stated that she used O2 [place] at 2 lpm all the time.  Patient stated that she was diagnosed with COPD and pulmonary fibrosis.  O2 2 lpm placed as per patient's request.

## 2017-03-25 NOTE — ED Provider Notes (Signed)
[facility] EMERGENCY DEPARTMENT Provider Note   CSN: [id number] Arrival date & time: 03/25/17  1514     History   Chief Complaint Chief Complaint  Patient presents with  . Chest Pain    HPI Jessica Fisher is a 67 y.o. female.  67 year old female with past medical history including COPD, IDDM, hypertension, bipolar disorder, pacemaker who presents with chest pain.  Patient reports 3 days of intermittent chest pain that she states "feels like my heart is going to crush open."  She has taken multiple doses of nitroglycerin for it including 5 doses today.  She reports that her heart stops multiple times during the night.  She was evaluated at an outside hospital ER 4 days ago where she states "they did nothing for me.  My heart stopped to 7 times while in the ER.  I was also vomiting, I vomited 37 and 57 times in the bathroom."  When asked about her hyperglycemia, she states that her blood sugar is always high, sometimes in the 6 and 700s at home.  She states she is compliant with all of her medications including her insulin and Xarelto.  She endorses shortness of breath stating that she has pulmonary fibrosis.  She states that she has contacted her PCP as well as several of her cardiologist regarding her chest pain and she states "no one will do anything for me."   The history is provided by the patient.  Chest Pain      Past Medical History:  Diagnosis Date  . Anxiety   . Blood clot in vein   . COPD (chronic obstructive pulmonary disease) ([place])   . Diabetes mellitus without complication ([place])   . Hypertension   . Thyroid disease   . Vitamin D deficiency     Patient Active Problem List   Diagnosis Date Noted  . Poor venous access 12/07/2014  . Thyroid disease 12/07/2014  . Ingrown nail 07/26/2014  . Paronychia 07/26/2014    Past Surgical History:  Procedure Laterality Date  . ABDOMINAL HYSTERECTOMY    . CESAREAN SECTION    . COLONOSCOPY  2015  .  ESOPHAGOSCOPY WITH DILITATION    . ESOPHAGOSCOPY WITH DILITATION N/A 12/12/2014   Procedure: ESOPHAGOSCOPY WITH DILITATION;  Surgeon: Margaretha Sheffield, MD;  Location: [facility] ORS;  Service: ENT;  Laterality: N/A;  . [place]  . PHARYNGEAL DILITATION    . PORT-A-CATH REMOVAL    . PORTACATH PLACEMENT    . [place]  . THYROIDECTOMY    . TRACHEOSTOMY  2014  . TUMOR REMOVAL      OB History    Gravida Para Term Preterm AB Living   3         3   SAB TAB Ectopic Multiple Live Births                  Obstetric Comments   1st Menstrual Cycle:  13 1st Pregnancy:  17        Home Medications    Prior to Admission medications   Medication Sig Start Date End Date Taking? Authorizing Provider  ALPRAZolam (XANAX) 1 MG tablet 1 mg 3 (three) times daily.  02/24/14   [provider]  amoxicillin-clavulanate (AUGMENTIN) 875-125 MG tablet Take 1 tablet by mouth 2 (two) times daily. 06/30/15   Sheard, Myeong O, DPM  canagliflozin (INVOKANA) 300 MG TABS tablet Take 300 mg by mouth once.  02/15/14   [provider]  cholecalciferol (VITAMIN D) 1000 UNITS tablet Take 1,000 Units by mouth daily.    [provider]  cloNIDine (CATAPRES) 0.1 MG tablet Take 0.1 mg by mouth 2 (two) times daily.    [provider]  cyclobenzaprine (FLEXERIL) 10 MG tablet Take 10 mg by mouth 3 (three) times daily as needed.  12/27/13   [provider]  diclofenac (FLECTOR) 1.3 % PTCH Place 2 patches onto the skin 2 (two) times daily.  02/01/14   [provider]  digoxin (LANOXIN) 0.25 MG tablet Take 0.25 mg by mouth daily.  02/10/14   [provider]  diltiazem (CARDIZEM CD) 300 MG 24 hr capsule Take 300 mg by mouth every evening.  02/10/14   [provider]  doxepin (SINEQUAN) 10 MG capsule Take 10 mg by mouth at bedtime.  03/01/14   [provider]  Dulaglutide (TRULICITY) 1.5 BM/8.4XL SOPN Inject 1 Units into the skin every 7  (seven) days.    [provider]  fluconazole (DIFLUCAN) 150 MG tablet  03/24/14   [provider]  furosemide (LASIX) 40 MG tablet Take 40 mg by mouth daily as needed.  03/12/14   [provider]  HYDROmorphone (DILAUDID) 4 MG tablet Take 4 mg by mouth 2 (two) times daily.  02/24/14   [provider]  Insulin Pen Needle (EASY TOUCH PEN NEEDLES) 31G X 8 MM MISC  01/27/14   [provider]  insulin regular human CONCENTRATED (HUMULIN R) 500 UNIT/ML injection Inject 50 Units into the skin 3 (three) times daily with meals.  03/01/14   [provider]  Insulin Syringe-Needle U-100 (BD INSULIN SYRINGE ULTRAFINE) 31G X 5/16" 1 ML MISC  01/27/14   [provider]  levothyroxine (SYNTHROID, LEVOTHROID) 200 MCG tablet Take 200 mcg by mouth daily before breakfast.  02/10/14   [provider]  Liraglutide (VICTOZA) 18 MG/3ML SOPN  02/10/14   [provider]  lisinopril (PRINIVIL,ZESTRIL) 10 MG tablet Take 10 mg by mouth daily. Mid morning    [provider]  lubiprostone (AMITIZA) 24 MCG capsule Take 2 mcg by mouth at bedtime.  02/10/14   [provider]  metoCLOPramide (REGLAN) 10 MG tablet Take 10 mg by mouth every 6 (six) hours as needed for nausea.  03/01/14   [provider]  metolazone (ZAROXOLYN) 5 MG tablet Take 5 mg by mouth daily. As needed 03/01/14   [provider]  nitroGLYCERIN (NITROSTAT) 0.4 MG SL tablet  03/01/14   [provider]  omeprazole (PRILOSEC) 40 MG capsule Take 40 mg by mouth 2 (two) times daily.  02/10/14   [provider]  oxyCODONE-acetaminophen (PERCOCET/ROXICET) 5-325 MG per tablet Take 1 tablet by mouth 2 (two) times daily.  03/23/14   [provider]  Oxymorphone HCl, Crush Resist, (OPANA ER, CRUSH RESISTANT,) 40 MG T12A Take 40 mg by mouth 2 (two) times daily.  02/24/14   [provider]  pregabalin (LYRICA) 300 MG capsule Take 300 mg by  mouth daily.  02/24/14   [provider]  QUEtiapine (SEROQUEL) 100 MG tablet  03/01/14   [provider]  rivaroxaban (XARELTO) 20 MG TABS tablet  03/14/14   [provider]  rosuvastatin (CRESTOR) 10 MG tablet Take 10 mg by mouth at bedtime.  02/10/14   [provider]  solifenacin (VESICARE) 10 MG tablet Take 10 mg by mouth at bedtime.  03/01/14   [provider]  Tiotropium Bromide Monohydrate (SPIRIVA RESPIMAT) 2.5 MCG/ACT  AERS Inhale 2 puffs into the lungs 2 (two) times daily as needed.  03/14/14   [provider]  venlafaxine XR (EFFEXOR-XR) 150 MG 24 hr capsule Take 150 mg by mouth at bedtime.  03/01/14   [provider]    Family History History reviewed. No pertinent family history.  Social History Social History   Tobacco Use  . Smoking status: Former Smoker    Packs/day: 1.00    Years: 26.00    Pack years: 26.00    Types: Cigarettes    Last attempt to quit: 12/07/1988    Years since quitting: 28.3  . Smokeless tobacco: Never Used  Substance Use Topics  . Alcohol use: No    Alcohol/week: 0.0 oz  . Drug use: No     Allergies   Flagyl [metronidazole]; Metformin and related; Talwin [pentazocine]; Tape; Biaxin [clarithromycin]; and Sulfa antibiotics   Review of Systems Review of Systems  Cardiovascular: Positive for chest pain.   All other systems reviewed and are negative except that which was mentioned in HPI  Physical Exam Updated Vital Signs BP 139/78 (BP Location: Left Arm)   Pulse 70   Temp 97.7 F (36.5 C) (Oral)   Resp 14   Ht 5\' 7"  (1.702 m)   Wt 96.2 kg (212 lb)   SpO2 100%   BMI 33.20 kg/m   Physical Exam  Constitutional: She is oriented to person, place, and time. She appears well-developed and well-nourished. No distress.  HENT:  Head: Normocephalic and atraumatic.  Moist mucous membranes  Eyes: Conjunctivae are normal. Pupils are equal, round, and reactive to light.  Neck: Neck  supple.  Cardiovascular: Normal rate, regular rhythm and normal heart sounds.  No murmur heard. Pulmonary/Chest: Effort normal and breath sounds normal.  Abdominal: Soft. Bowel sounds are normal. She exhibits no distension. There is no tenderness.  Musculoskeletal: She exhibits no edema.  Neurological: She is alert and oriented to person, place, and time.  Fluent speech  Skin: Skin is warm and dry.  Psychiatric: Judgment normal.  Pressured, loud speech  Nursing note and vitals reviewed.    ED Treatments / Results  Labs (all labs ordered are listed, but only abnormal results are displayed) Labs Reviewed  BASIC METABOLIC PANEL - Abnormal; Notable for the following components:      Result Value   Sodium 130 (*)    Chloride 95 (*)    Glucose, Bld 413 (*)    All other components within normal limits  CBC - Abnormal; Notable for the following components:   RBC 5.34 (*)    Hemoglobin 16.1 (*)    All other components within normal limits  CBG MONITORING, ED - Abnormal; Notable for the following components:   Glucose-Capillary 285 (*)    All other components within normal limits  TROPONIN I  TROPONIN I    EKG  EKG Interpretation  Date/Time:  Tuesday March 25 2017 15:15:13 EST Ventricular Rate:  90 PR Interval:  138 QRS Duration: 100 QT Interval:  354 QTC Calculation: 433 R Axis:   -13 Text Interpretation:  Normal sinus rhythm Moderate voltage criteria for LVH, may be normal variant Borderline ECG No significant change since last tracing Confirmed by Theotis Burrow [phone]) on 03/25/2017 4:37:57 PM       Radiology Dg Chest 2 View  Result Date: 03/25/2017 CLINICAL DATA:  LEFT side chest pain extending to arm for 3 weeks, cough, fever, vomiting, diarrhea EXAM: CHEST  2 VIEW COMPARISON:  01/26/2015 FINDINGS: Old  catheter tubing LEFT subclavian extending to SVC. RIGHT subclavian pacemaker leads project over RIGHT atrium and RIGHT ventricle. Upper normal size of cardiac  silhouette. Mediastinal contours and pulmonary vascularity normal. Atherosclerotic calcification aorta. Chronic bronchitic changes without infiltrate, pleural effusion or pneumothorax. Bones demineralized without acute osseous findings. IMPRESSION: Chronic bronchitic changes without acute abnormalities. Electronically Signed   By: Lavonia Dana M.D.   On: 03/25/2017 16:36    Procedures Procedures (including critical care time)  Medications Ordered in ED Medications  insulin aspart (novoLOG) injection 5 Units (5 Units Subcutaneous Given 03/25/17 1748)     Initial Impression / Assessment and Plan / ED Course  I have reviewed the triage vital signs and the nursing notes.  Pertinent labs & imaging results that were available during my care of the patient were reviewed by me and considered in my medical decision making (see chart for details).     Pt non-toxic on exam w/ reassuring VS. No ischemic changes on EKG. labs show glucose 413 but no evidence of DKA with normal anion gap.  Negative serial troponins.  Gave insulin and IV fluids and glucose improved to 285.  The patient's description of "my heart stopped 7 times at home" and many of her other complaints are similar to multiple other documents in her chart including previous evaluations in the cardiology office and at other ERs.  I reviewed her workup at recent ER visit which was also reassuring against ACS.  Given her multitude of symptoms and very atypical description, I feel she is safe for discharge.  Have instructed to follow-up with cardiology and her endocrinologist.  Return precautions given.  Final Clinical Impressions(s) / ED Diagnoses   Final diagnoses:  Atypical chest pain  Hyperglycemia    ED Discharge Orders    None       Little, Wenda Overland, MD 03/25/17 2349

## 2017-03-25 NOTE — ED Triage Notes (Addendum)
Patient states that she has had chest pain x 3 weeks. Reports her "heart keeps stopping" - she "has a machine at her bedside that tells me when my heart stops" - The patient also states that she has taken 5 SL nitro  - patient already went to [place] on Friday for the same

## 2017-03-25 NOTE — ED Notes (Signed)
ED Provider at bedside. 

## 2017-04-09 ENCOUNTER — Other Ambulatory Visit: Payer: Self-pay | Admitting: Otolaryngology

## 2017-04-09 DIAGNOSIS — K1123 Chronic sialoadenitis: Secondary | ICD-10-CM

## 2017-04-10 ENCOUNTER — Other Ambulatory Visit: Payer: Self-pay

## 2017-04-10 MED ORDER — PEG 3350-KCL-NABCB-NACL-NASULF 236 G PO SOLR: ORAL | 0 refills | Status: DC

## 2017-04-11 ENCOUNTER — Other Ambulatory Visit: Payer: Self-pay

## 2017-04-11 DIAGNOSIS — Z1211 Encounter for screening for malignant neoplasm of colon: Secondary | ICD-10-CM

## 2017-04-23 ENCOUNTER — Ambulatory Visit
Admission: RE | Admit: 2017-04-23 | Discharge: 2017-04-23 | Disposition: A | Discharge status: Home / Self Care | Payer: Medicare HMO | Source: Ambulatory Visit | Attending: Otolaryngology | Admitting: Otolaryngology

## 2017-04-23 DIAGNOSIS — K1123 Chronic sialoadenitis: Secondary | ICD-10-CM | POA: Diagnosis not present

## 2017-04-23 DIAGNOSIS — M47812 Spondylosis without myelopathy or radiculopathy, cervical region: Secondary | ICD-10-CM | POA: Insufficient documentation

## 2017-04-23 DIAGNOSIS — E890 Postprocedural hypothyroidism: Secondary | ICD-10-CM | POA: Insufficient documentation

## 2017-04-23 DIAGNOSIS — E079 Disorder of thyroid, unspecified: Secondary | ICD-10-CM | POA: Diagnosis not present

## 2017-04-23 MED ORDER — IOPAMIDOL (ISOVUE-300) INJECTION 61%
75.0000 mL | Freq: Once | INTRAVENOUS | Status: AC | PRN
  Administered 2017-04-23: 75 mL via INTRAVENOUS

## 2017-04-25 ENCOUNTER — Telehealth: Payer: Self-pay

## 2017-04-25 NOTE — Telephone Encounter (Signed)
Pt has been notified to stop Xarelto 20mg  3 days prior to colonoscopy and EGD and restart 1 day after. Refer to Media for clearance details.

## 2017-04-28 ENCOUNTER — Encounter: Payer: Self-pay | Admitting: Emergency Medicine

## 2017-04-28 ENCOUNTER — Other Ambulatory Visit: Payer: Self-pay

## 2017-04-28 MED ORDER — NA SULFATE-K SULFATE-MG SULF 17.5-3.13-1.6 GM/177ML PO SOLN: 1.0000 | ORAL | 0 refills | Status: DC

## 2017-04-29 ENCOUNTER — Encounter
Admission: RE | Disposition: A | Discharge status: Home / Self Care | Payer: Self-pay | Source: Ambulatory Visit | Attending: Gastroenterology

## 2017-04-29 ENCOUNTER — Other Ambulatory Visit: Payer: Self-pay

## 2017-04-29 ENCOUNTER — Ambulatory Visit
Admission: RE | Admit: 2017-04-29 | Discharge: 2017-04-29 | Disposition: A | Discharge status: Home / Self Care | Payer: Medicare HMO | Source: Ambulatory Visit | Attending: Gastroenterology | Admitting: Gastroenterology

## 2017-04-29 ENCOUNTER — Ambulatory Visit: Payer: Medicare HMO | Admitting: Anesthesiology

## 2017-04-29 ENCOUNTER — Encounter: Payer: Self-pay | Admitting: *Deleted

## 2017-04-29 DIAGNOSIS — I10 Essential (primary) hypertension: Secondary | ICD-10-CM | POA: Insufficient documentation

## 2017-04-29 DIAGNOSIS — Z1211 Encounter for screening for malignant neoplasm of colon: Secondary | ICD-10-CM | POA: Diagnosis not present

## 2017-04-29 DIAGNOSIS — D122 Benign neoplasm of ascending colon: Secondary | ICD-10-CM | POA: Diagnosis not present

## 2017-04-29 DIAGNOSIS — E559 Vitamin D deficiency, unspecified: Secondary | ICD-10-CM | POA: Insufficient documentation

## 2017-04-29 DIAGNOSIS — F419 Anxiety disorder, unspecified: Secondary | ICD-10-CM | POA: Insufficient documentation

## 2017-04-29 DIAGNOSIS — Z9981 Dependence on supplemental oxygen: Secondary | ICD-10-CM | POA: Insufficient documentation

## 2017-04-29 DIAGNOSIS — D120 Benign neoplasm of cecum: Secondary | ICD-10-CM

## 2017-04-29 DIAGNOSIS — Z90710 Acquired absence of both cervix and uterus: Secondary | ICD-10-CM | POA: Insufficient documentation

## 2017-04-29 DIAGNOSIS — D123 Benign neoplasm of transverse colon: Secondary | ICD-10-CM | POA: Diagnosis not present

## 2017-04-29 DIAGNOSIS — E119 Type 2 diabetes mellitus without complications: Secondary | ICD-10-CM | POA: Insufficient documentation

## 2017-04-29 DIAGNOSIS — K635 Polyp of colon: Secondary | ICD-10-CM

## 2017-04-29 DIAGNOSIS — E890 Postprocedural hypothyroidism: Secondary | ICD-10-CM | POA: Diagnosis not present

## 2017-04-29 DIAGNOSIS — Z950 Presence of cardiac pacemaker: Secondary | ICD-10-CM | POA: Insufficient documentation

## 2017-04-29 DIAGNOSIS — D125 Benign neoplasm of sigmoid colon: Secondary | ICD-10-CM | POA: Insufficient documentation

## 2017-04-29 DIAGNOSIS — R1310 Dysphagia, unspecified: Secondary | ICD-10-CM | POA: Diagnosis not present

## 2017-04-29 DIAGNOSIS — Z79899 Other long term (current) drug therapy: Secondary | ICD-10-CM | POA: Diagnosis not present

## 2017-04-29 DIAGNOSIS — Z881 Allergy status to other antibiotic agents status: Secondary | ICD-10-CM | POA: Diagnosis not present

## 2017-04-29 DIAGNOSIS — J449 Chronic obstructive pulmonary disease, unspecified: Secondary | ICD-10-CM | POA: Insufficient documentation

## 2017-04-29 DIAGNOSIS — Z888 Allergy status to other drugs, medicaments and biological substances status: Secondary | ICD-10-CM | POA: Diagnosis not present

## 2017-04-29 DIAGNOSIS — Z882 Allergy status to sulfonamides status: Secondary | ICD-10-CM | POA: Diagnosis not present

## 2017-04-29 DIAGNOSIS — Z860100 Personal history of colon polyps, unspecified: Secondary | ICD-10-CM

## 2017-04-29 DIAGNOSIS — Z87891 Personal history of nicotine dependence: Secondary | ICD-10-CM | POA: Insufficient documentation

## 2017-04-29 HISTORY — PX: ESOPHAGOGASTRODUODENOSCOPY (EGD) WITH PROPOFOL: SHX5813

## 2017-04-29 HISTORY — PX: COLONOSCOPY WITH PROPOFOL: SHX5780

## 2017-04-29 LAB — GLUCOSE, CAPILLARY: Glucose-Capillary: 129 mg/dL — ABNORMAL HIGH (ref 65–99)

## 2017-04-29 SURGERY — COLONOSCOPY WITH PROPOFOL
Anesthesia: General

## 2017-04-29 MED ORDER — LIDOCAINE HCL (PF) 1 % IJ SOLN: 2.0000 mL | Freq: Once | INTRAMUSCULAR | Status: DC

## 2017-04-29 MED ORDER — PROPOFOL 10 MG/ML IV BOLUS
INTRAVENOUS | Status: DC | PRN
  Administered 2017-04-29 (×3): 20 mg via INTRAVENOUS
  Administered 2017-04-29: 60 mg via INTRAVENOUS

## 2017-04-29 MED ORDER — FENTANYL CITRATE (PF) 100 MCG/2ML IJ SOLN
INTRAMUSCULAR | Status: DC | PRN
  Administered 2017-04-29 (×2): 25 ug via INTRAVENOUS
  Administered 2017-04-29: 50 ug via INTRAVENOUS

## 2017-04-29 MED ORDER — SODIUM CHLORIDE 0.9 % IV SOLN
INTRAVENOUS | Status: DC
  Administered 2017-04-29: 10:00:00 via INTRAVENOUS

## 2017-04-29 MED ORDER — PHENYLEPHRINE HCL 10 MG/ML IJ SOLN
INTRAMUSCULAR | Status: DC | PRN
  Administered 2017-04-29 (×2): 100 ug via INTRAVENOUS

## 2017-04-29 MED ORDER — MIDAZOLAM HCL 2 MG/2ML IJ SOLN
INTRAMUSCULAR | Status: AC
  Filled 2017-04-29: qty 2

## 2017-04-29 MED ORDER — FENTANYL CITRATE (PF) 100 MCG/2ML IJ SOLN
INTRAMUSCULAR | Status: AC
  Filled 2017-04-29: qty 2

## 2017-04-29 MED ORDER — LACTATED RINGERS IV SOLN
INTRAVENOUS | Status: DC | PRN
  Administered 2017-04-29: 11:00:00 via INTRAVENOUS

## 2017-04-29 MED ORDER — PROPOFOL 500 MG/50ML IV EMUL
INTRAVENOUS | Status: DC | PRN
  Administered 2017-04-29: 120 ug/kg/min via INTRAVENOUS

## 2017-04-29 MED ORDER — LIDOCAINE HCL (CARDIAC) 20 MG/ML IV SOLN
INTRAVENOUS | Status: DC | PRN
  Administered 2017-04-29: 60 mg via INTRAVENOUS

## 2017-04-29 MED ORDER — MIDAZOLAM HCL 2 MG/2ML IJ SOLN
INTRAMUSCULAR | Status: DC | PRN
  Administered 2017-04-29: 2 mg via INTRAVENOUS

## 2017-04-29 NOTE — Op Note (Signed)
[facility] 2 Gastroenterology Patient Name: Scott Fix Procedure Date: 04/29/2017 10:31 AM MRN: [id number] Account #: [id number] Date of Birth: 1950-07-24 Admit Type: Outpatient Age: 67 Room: [facility] ENDO ROOM 4 Gender: Female Note Status: Finalized Procedure:            Colonoscopy Indications:          High risk colon cancer surveillance: Personal history                        of colonic polyps Providers:            Lucilla Lame MD, MD Referring MD:         Aldean Baker. Arvind MD (Referring MD) Medicines:            Propofol per Anesthesia Complications:        No immediate complications. Procedure:            Pre-Anesthesia Assessment:                       - Prior to the procedure, a History and Physical was                        performed, and patient medications and allergies were                        reviewed. The patient's tolerance of previous                        anesthesia was also reviewed. The risks and benefits of                        the procedure and the sedation options and risks were                        discussed with the patient. All questions were                        answered, and informed consent was obtained. Prior                        Anticoagulants: The patient has taken no previous                        anticoagulant or antiplatelet agents. ASA Grade                        Assessment: II - A patient with mild systemic disease.                        After reviewing the risks and benefits, the patient was                        deemed in satisfactory condition to undergo the                        procedure.                       After obtaining informed consent, the colonoscope was  passed under direct vision. Throughout the procedure,                        the patient's blood pressure, pulse, and oxygen                        saturations were monitored continuously. The                        Colonoscope  was introduced through the anus and                        advanced to the the cecum, identified by appendiceal                        orifice and ileocecal valve. The colonoscopy was                        performed without difficulty. The patient tolerated the                        procedure well. The quality of the bowel preparation                        was poor. Findings:      The perianal and digital rectal examinations were normal.      A 2 mm polyp was found in the cecum. The polyp was sessile. The polyp       was removed with a cold biopsy forceps. Resection and retrieval were       complete.      A 4 mm polyp was found in the ascending colon. The polyp was sessile.       The polyp was removed with a cold snare. Resection and retrieval were       complete.      A 3 mm polyp was found in the transverse colon. The polyp was sessile.       The polyp was removed with a cold biopsy forceps. Resection and       retrieval were complete.      Four sessile polyps were found in the sigmoid colon. The polyps were 3       to 5 mm in size. These polyps were removed with a cold biopsy forceps.       Resection and retrieval were complete. Impression:           - Preparation of the colon was poor.                       - One 2 mm polyp in the cecum, removed with a cold                        biopsy forceps. Resected and retrieved.                       - One 4 mm polyp in the ascending colon, removed with a                        cold snare. Resected and retrieved.                       -  One 3 mm polyp in the transverse colon, removed with                        a cold biopsy forceps. Resected and retrieved.                       - Four 3 to 5 mm polyps in the sigmoid colon, removed                        with a cold biopsy forceps. Resected and retrieved. Recommendation:       - Discharge patient to home.                       - Resume previous diet.                       - Continue  present medications.                       - Await pathology results.                       - Repeat colonoscopy in 3 years for surveillance. Procedure Code(s):    --- Professional ---                       [id number], Colonoscopy, flexible; with removal of tumor(s),                        polyp(s), or other lesion(s) by snare technique                       45380, 15, Colonoscopy, flexible; with biopsy, single                        or multiple Diagnosis Code(s):    --- Professional ---                       Z86.010, Personal history of colonic polyps                       D12.0, Benign neoplasm of cecum                       D12.2, Benign neoplasm of ascending colon                       D12.3, Benign neoplasm of transverse colon (hepatic                        flexure or splenic flexure)                       D12.5, Benign neoplasm of sigmoid colon CPT copyright 2017 [employer]. All rights reserved. The codes documented in this report are preliminary and upon coder review may  be revised to meet current compliance requirements. Lucilla Lame MD, MD 04/29/2017 11:15:24 AM This report has been signed electronically. Number of Addenda: 0 Note Initiated On: 04/29/2017 10:31 AM Scope Withdrawal Time: 0 hours 9 minutes 51 seconds  Total Procedure Duration: 0 hours 20 minutes 10 seconds  [facility]

## 2017-04-29 NOTE — Anesthesia Post-op Follow-up Note (Signed)
Anesthesia QCDR form completed.        

## 2017-04-29 NOTE — H&P (Signed)
Jessica Lame, MD [address] [place], [place] [address] Phone:[phone] Fax : [phone]  Primary Care Physician:  Guadlupe Spanish, MD Primary Gastroenterologist:  Dr. Allen Norris  Pre-Procedure History & Physical: HPI:  Jessica Fisher is a 67 y.o. female is here for an endoscopy and colonoscopy.   Past Medical History:  Diagnosis Date  . Anxiety   . Blood clot in vein   . COPD (chronic obstructive pulmonary disease) ([place])   . Diabetes mellitus without complication ([place])   . Hypertension   . Thyroid disease   . Vitamin D deficiency     Past Surgical History:  Procedure Laterality Date  . ABDOMINAL HYSTERECTOMY    . CESAREAN SECTION    . COLONOSCOPY  2015  . ESOPHAGOSCOPY WITH DILITATION    . ESOPHAGOSCOPY WITH DILITATION N/A 12/12/2014   Procedure: ESOPHAGOSCOPY WITH DILITATION;  Surgeon: Margaretha Sheffield, MD;  Location: [facility] ORS;  Service: ENT;  Laterality: N/A;  . INSERT / REPLACE / REMOVE PACEMAKER    . PACEMAKER INSERTION  1994  . PHARYNGEAL DILITATION    . PORT-A-CATH REMOVAL    . PORTACATH PLACEMENT    . [place]  . THYROIDECTOMY    . TRACHEOSTOMY  2014  . TUMOR REMOVAL      Prior to Admission medications   Medication Sig Start Date End Date Taking? Authorizing Provider  ALPRAZolam (XANAX) 1 MG tablet 1 mg 3 (three) times daily.  02/24/14   [provider]  amoxicillin-clavulanate (AUGMENTIN) 875-125 MG tablet Take 1 tablet by mouth 2 (two) times daily. 06/30/15   Sheard, Myeong O, DPM  canagliflozin (INVOKANA) 300 MG TABS tablet Take 300 mg by mouth once.  02/15/14   [provider]  cholecalciferol (VITAMIN D) 1000 UNITS tablet Take 1,000 Units by mouth daily.    [provider]  cloNIDine (CATAPRES) 0.1 MG tablet Take 0.1 mg by mouth 2 (two) times daily.    [provider]  cyclobenzaprine (FLEXERIL) 10 MG tablet Take 10 mg by mouth 3 (three) times daily as needed.  12/27/13   [provider]    diclofenac (FLECTOR) 1.3 % PTCH Place 2 patches onto the skin 2 (two) times daily.  02/01/14   [provider]  digoxin (LANOXIN) 0.25 MG tablet Take 0.25 mg by mouth daily.  02/10/14   [provider]  diltiazem (CARDIZEM CD) 300 MG 24 hr capsule Take 300 mg by mouth every evening.  02/10/14   [provider]  doxepin (SINEQUAN) 10 MG capsule Take 10 mg by mouth at bedtime.  03/01/14   [provider]  Dulaglutide (TRULICITY) 1.5 SL/3.7DS SOPN Inject 1 Units into the skin every 7 (seven) days.    [provider]  fluconazole (DIFLUCAN) 150 MG tablet  03/24/14   [provider]  furosemide (LASIX) 40 MG tablet Take 40 mg by mouth daily as needed.  03/12/14   [provider]  HYDROmorphone (DILAUDID) 4 MG tablet Take 4 mg by mouth 2 (two) times daily.  02/24/14   [provider]  Insulin Pen Needle (EASY TOUCH PEN NEEDLES) 31G X 8 MM MISC  01/27/14   [provider]  insulin regular human CONCENTRATED (HUMULIN R) 500 UNIT/ML injection Inject 50 Units into the skin 3 (three) times daily with meals.  03/01/14   [provider]  Insulin Syringe-Needle U-100 (BD INSULIN SYRINGE ULTRAFINE) 31G X 5/16" 1 ML MISC  01/27/14   [provider]  levothyroxine (SYNTHROID, LEVOTHROID) 200 MCG tablet Take 200 mcg by mouth daily before breakfast.  02/10/14   [provider]  Liraglutide (VICTOZA) 18 MG/3ML SOPN  02/10/14   [provider]  lisinopril (PRINIVIL,ZESTRIL) 10 MG tablet Take 10 mg by mouth daily. Mid morning    [provider]  lubiprostone (AMITIZA) 24 MCG capsule Take 2 mcg by mouth at bedtime.  02/10/14   [provider]  metoCLOPramide (REGLAN) 10 MG tablet Take 10 mg by mouth every 6 (six) hours as needed for nausea.  03/01/14   [provider]  metolazone (ZAROXOLYN) 5 MG tablet Take 5 mg by mouth daily. As needed 03/01/14   [provider]  Na Sulfate-K  Sulfate-Mg Sulf (SUPREP BOWEL PREP KIT) 17.5-3.13-1.6 GM/177ML SOLN Take 1 kit by mouth as directed. 04/28/17   Jessica Lame, MD  nitroGLYCERIN (NITROSTAT) 0.4 MG SL tablet  03/01/14   [provider]  omeprazole (PRILOSEC) 40 MG capsule Take 40 mg by mouth 2 (two) times daily.  02/10/14   [provider]  oxyCODONE-acetaminophen (PERCOCET/ROXICET) 5-325 MG per tablet Take 1 tablet by mouth 2 (two) times daily.  03/23/14   [provider]  Oxymorphone HCl, Crush Resist, (OPANA ER, CRUSH RESISTANT,) 40 MG T12A Take 40 mg by mouth 2 (two) times daily.  02/24/14   [provider]  polyethylene glycol (GOLYTELY) 236 g solution Drink one 8 oz glass every 20 mins until you finish container starting at 5:00pm on 04/28/17. 04/10/17   Jessica Lame, MD  pregabalin (LYRICA) 300 MG capsule Take 300 mg by mouth daily.  02/24/14   [provider]  QUEtiapine (SEROQUEL) 100 MG tablet  03/01/14   [provider]  rivaroxaban (XARELTO) 20 MG TABS tablet  03/14/14   [provider]  rosuvastatin (CRESTOR) 10 MG tablet Take 10 mg by mouth at bedtime.  02/10/14   [provider]  solifenacin (VESICARE) 10 MG tablet Take 10 mg by mouth at bedtime.  03/01/14   [provider]  Tiotropium Bromide Monohydrate (SPIRIVA RESPIMAT) 2.5 MCG/ACT AERS Inhale 2 puffs into the lungs 2 (two) times daily as needed.  03/14/14   [provider]  venlafaxine XR (EFFEXOR-XR) 150 MG 24 hr capsule Take 150 mg by mouth at bedtime.  03/01/14   [provider]    Allergies as of 04/11/2017 - Review Complete 03/25/2017  Allergen Reaction Noted  . Flagyl [metronidazole] Itching 07/26/2014  . Metformin and related Itching 07/26/2014  . Talwin [pentazocine] Itching 07/26/2014  . Tape Other (See Comments) 12/08/2014  . Biaxin [clarithromycin] Itching 07/26/2014  . Sulfa antibiotics Itching 07/26/2014    History reviewed. No pertinent family history.  Social  History   Socioeconomic History  . Marital status: Widowed    Spouse name: Not on file  . Number of children: Not on file  . Years of education: Not on file  . Highest education level: Not on file  Occupational History  . Not on file  Social Needs  . Financial resource strain: Not on file  . Food insecurity:    Worry: Not on file    Inability: Not on file  . Transportation needs:    Medical: Not on file    Non-medical: Not on file  Tobacco Use  . Smoking status: Former Smoker    Packs/day: 1.00    Years: 26.00    Pack years: 26.00    Types: Cigarettes    Last attempt to quit: 12/07/1988  Years since quitting: 28.4  . Smokeless tobacco: Never Used  Substance and Sexual Activity  . Alcohol use: No    Alcohol/week: 0.0 oz  . Drug use: No  . Sexual activity: Not on file  Lifestyle  . Physical activity:    Days per week: Not on file    Minutes per session: Not on file  . Stress: Not on file  Relationships  . Social connections:    Talks on phone: Not on file    Gets together: Not on file    Attends religious service: Not on file    Active member of club or organization: Not on file    Attends meetings of clubs or organizations: Not on file    Relationship status: Not on file  . Intimate partner violence:    Fear of current or ex partner: Not on file    Emotionally abused: Not on file    Physically abused: Not on file    Forced sexual activity: Not on file  Other Topics Concern  . Not on file  Social History Narrative  . Not on file    Review of Systems: See HPI, otherwise negative ROS  Physical Exam: BP (!) 141/77   Pulse 70   Temp (!) 97.4 F (36.3 C) (Tympanic)   Resp 16   Ht '5\' 7"'$  (1.702 m)   Wt 210 lb (95.3 kg)   SpO2 98%   BMI 32.89 kg/m  General:   Alert,  pleasant and cooperative in NAD Head:  Normocephalic and atraumatic. Neck:  Supple; no masses or thyromegaly. Lungs:  Clear throughout to auscultation.    Heart:  Regular rate and  rhythm. Abdomen:  Soft, nontender and nondistended. Normal bowel sounds, without guarding, and without rebound.   Neurologic:  Alert and  oriented x4;  grossly normal neurologically.  Impression/Plan: Jessica Fisher is here for an endoscopy and colonoscopy to be performed for Dysphagia and history of colon polyps  Risks, benefits, limitations, and alternatives regarding  endoscopy and colonoscopy have been reviewed with the patient.  Questions have been answered.  All parties agreeable.   Jessica Lame, MD  04/29/2017, 10:29 AM

## 2017-04-29 NOTE — Transfer of Care (Cosign Needed)
Immediate Anesthesia Transfer of Care Note  Patient: Jessica Fisher  Procedure(s) Performed: COLONOSCOPY WITH PROPOFOL (N/A ) ESOPHAGOGASTRODUODENOSCOPY (EGD) WITH PROPOFOL (N/A )  Patient Location: PACU  Anesthesia Type:General  Level of Consciousness: awake, alert  and oriented  Airway & Oxygen Therapy: Patient Spontanous Breathing  Post-op Assessment: Report given to RN and Post -op Vital signs reviewed and stable  Post vital signs: Reviewed and stable  Last Vitals:  Vitals Value Taken Time  BP 126/83 04/29/2017 11:23 AM  Temp 36.3 C 04/29/2017 11:21 AM  Pulse 75 04/29/2017 11:23 AM  Resp 15 04/29/2017 11:23 AM  SpO2 100 % 04/29/2017 11:23 AM  Vitals shown include unvalidated device data.  Last Pain:  Vitals:   04/29/17 1121  TempSrc: Tympanic  PainSc:          Complications: No apparent anesthesia complications

## 2017-04-29 NOTE — Anesthesia Procedure Notes (Cosign Needed)
Date/Time: 04/29/2017 10:33 AM Performed by: Molli Barrows, MD Pre-anesthesia Checklist: Timeout performed, Patient being monitored, Suction available, Emergency Drugs available and Patient identified Patient Re-evaluated:Patient Re-evaluated prior to induction Oxygen Delivery Method: Nasal cannula Induction Type: IV induction

## 2017-04-29 NOTE — Anesthesia Preprocedure Evaluation (Signed)
Anesthesia Evaluation  Patient identified by MRN, date of birth, ID band Patient awake    Reviewed: Allergy & Precautions, H&P , NPO status , Patient's Chart, lab work & pertinent test results, reviewed documented beta blocker date and time   Airway Mallampati: II   Neck ROM: full    Dental  (+) Poor Dentition   Pulmonary neg pulmonary ROS, COPD,  COPD inhaler and oxygen dependent, former smoker,    Pulmonary exam normal        Cardiovascular Exercise Tolerance: Poor hypertension, On Medications negative cardio ROS Normal cardiovascular exam Rhythm:regular Rate:Normal     Neuro/Psych Anxiety negative neurological ROS  negative psych ROS   GI/Hepatic negative GI ROS, Neg liver ROS,   Endo/Other  negative endocrine ROSdiabetes  Renal/GU negative Renal ROS  negative genitourinary   Musculoskeletal   Abdominal   Peds  Hematology negative hematology ROS (+)   Anesthesia Other Findings Past Medical History: No date: Anxiety No date: Blood clot in vein No date: COPD (chronic obstructive pulmonary disease) (HCC) No date: Diabetes mellitus without complication (HCC) No date: Hypertension No date: Thyroid disease No date: Vitamin D deficiency Past Surgical History: No date: ABDOMINAL HYSTERECTOMY No date: CESAREAN SECTION 2015: COLONOSCOPY No date: ESOPHAGOSCOPY WITH DILITATION 12/12/2014: ESOPHAGOSCOPY WITH DILITATION; N/A     Comment:  Procedure: ESOPHAGOSCOPY WITH DILITATION;  Surgeon: Margaretha Sheffield, MD;  Location: [facility] ORS;  Service: ENT;                Laterality: N/A; No date: INSERT / REPLACE / [place]: PACEMAKER INSERTION No date: PHARYNGEAL DILITATION No date: PORT-A-CATH REMOVAL No date: PORTACATH PLACEMENT 1990: SPINE SURGERY No date: THYROIDECTOMY 2014: TRACHEOSTOMY No date: TUMOR REMOVAL BMI    Body Mass Index:  32.89 kg/m     Reproductive/Obstetrics negative OB  ROS                             Anesthesia Physical Anesthesia Plan  ASA: III  Anesthesia Plan: General   Post-op Pain Management:    Induction:   PONV Risk Score and Plan:   Airway Management Planned:   Additional Equipment:   Intra-op Plan:   Post-operative Plan:   Informed Consent: I have reviewed the patients History and Physical, chart, labs and discussed the procedure including the risks, benefits and alternatives for the proposed anesthesia with the patient or authorized representative who has indicated his/her understanding and acceptance.   Dental Advisory Given  Plan Discussed with: CRNA  Anesthesia Plan Comments:         Anesthesia Quick Evaluation

## 2017-04-29 NOTE — Op Note (Signed)
[facility]astroenter[facility] Patient Name: Jessica Fisher Procedure Date: 04/29/2017 10:32 AM MRN: [id number] Account #: [id number] Date of Birth: 1950/12/04 Admit Type: Outpatient Age: 67 Room: [facility] ENDO ROOM 4 Gender: Female Note Status: Finalized Procedure:            Upper GI endoscopy Indications:          Dysphagia Providers:            Lucilla Lame MD, MD Referring MD:         Aldean Baker. Arvind MD (Referring MD) Medicines:            Propofol per Anesthesia Complications:        No immediate complications. Procedure:            Pre-Anesthesia Assessment:                       - Prior to the procedure, a History and Physical was                        performed, and patient medications and allergies were                        reviewed. The patient's tolerance of previous                        anesthesia was also reviewed. The risks and benefits of                        the procedure and the sedation options and risks were                        discussed with the patient. All questions were                        answered, and informed consent was obtained. Prior                        Anticoagulants: The patient has taken no previous                        anticoagulant or antiplatelet agents. ASA Grade                        Assessment: II - A patient with mild systemic disease.                        After reviewing the risks and benefits, the patient was                        deemed in satisfactory condition to undergo the                        procedure.                       After obtaining informed consent, the endoscope was                        passed under direct vision. Throughout the procedure,  the patient's blood pressure, pulse, and oxygen                        saturations were monitored continuously. The Endoscope                        was introduced through the mouth, and advanced to the   second part of duodenum. The upper GI endoscopy was                        accomplished without difficulty. The patient tolerated                        the procedure well. Findings:      The examined esophagus was normal. A TTS dilator was passed through the       scope. Dilation with a 15-16.5-18 mm balloon dilator was performed to 18       mm. The dilation site was examined following endoscope reinsertion and       showed complete resolution of luminal narrowing.      The stomach was normal.      The examined duodenum was normal. Impression:           - Normal esophagus. Dilated.                       - Normal stomach.                       - Normal examined duodenum.                       - No specimens collected. Recommendation:       - Discharge patient to home.                       - Resume previous diet.                       - Continue present medications.                       - Perform a colonoscopy today. Procedure Code(s):    --- Professional ---                       [phone], Esophagogastroduodenoscopy, flexible, transoral;                        with transendoscopic balloon dilation of esophagus                        (less than 30 mm diameter) Diagnosis Code(s):    --- Professional ---                       R13.10, Dysphagia, unspecified CPT copyright 2017 American Medical Association. All rights reserved. The codes documented in this report are preliminary and upon coder review may  be revised to meet current compliance requirements. Lucilla Lame MD, MD 04/29/2017 10:51:06 AM This report has been signed electronically. Number of Addenda: 0 Note Initiated On: 04/29/2017 10:32 AM      [facility]

## 2017-04-30 NOTE — Anesthesia Postprocedure Evaluation (Signed)
Anesthesia Post Note  Patient: Jessica Fisher  Procedure(s) Performed: COLONOSCOPY WITH PROPOFOL (N/A ) ESOPHAGOGASTRODUODENOSCOPY (EGD) WITH PROPOFOL (N/A )  Patient location during evaluation: PACU Anesthesia Type: General Level of consciousness: awake and alert Pain management: pain level controlled Vital Signs Assessment: post-procedure vital signs reviewed and stable Respiratory status: spontaneous breathing, nonlabored ventilation, respiratory function stable and patient connected to nasal cannula oxygen Cardiovascular status: blood pressure returned to baseline and stable Postop Assessment: no apparent nausea or vomiting Anesthetic complications: no     Last Vitals:  Vitals:   04/29/17 1123 04/29/17 1130  BP: 126/83 131/74  Pulse: 74 69  Resp: 18 15  Temp:    SpO2: 100% 100%    Last Pain:  Vitals:   04/30/17 0738  TempSrc:   PainSc: 0-No pain                 Molli Barrows

## 2017-05-01 LAB — SURGICAL PATHOLOGY

## 2017-05-02 ENCOUNTER — Encounter: Payer: Self-pay | Admitting: Gastroenterology

## 2017-05-07 ENCOUNTER — Telehealth: Payer: Self-pay | Admitting: Gastroenterology

## 2017-05-07 NOTE — Telephone Encounter (Signed)
Please call with results

## 2017-05-12 ENCOUNTER — Telehealth: Payer: Self-pay | Admitting: Gastroenterology

## 2017-05-12 NOTE — Telephone Encounter (Signed)
Pt notified of colonoscopy results and result letter that was mailed out to her on 05/02/17.

## 2017-05-12 NOTE — Telephone Encounter (Signed)
Pt left vm she  would like a call from Ginger

## 2018-11-04 ENCOUNTER — Telehealth: Payer: Self-pay

## 2018-11-04 NOTE — Telephone Encounter (Signed)
Referral notes sent to scheduling  From: Dr. Lawson Radar [facility] Phone: [phone] Ask for Craig Guess Fax: [phone] Attn: Craig Guess

## 2018-12-05 NOTE — Progress Notes (Signed)
CARDIOLOGY CONSULT NOTE       Patient ID: Jessica Fisher MRN: [id number] DOB/AGE: [birth year] 70 y.o.  Admit date: (Not on file) Referring Physician: [facility]  Primary Physician: Guadlupe Spanish, MD Primary Cardiologist: Claudie Leach Reason for Consultation: Sick Sinus Syndrome   Active Problems:   * No active hospital problems. *   HPI:  68 y.o. history anxiety, , HLD COPD, DM, HTN, Thyroid disease Previous history of PPM insertion She is on chronic opoid Rx for back pain She has multiple allergies including lisinopril and metformin for pruritis Former smoker quit years ago She had a normal stress test with Dr Duke Salvia at [place] in June of 2010 CXR 03/25/17 showed ? Old left subclavian catheter tubing extending into SVC and right subclavian PPM  With no CE and bronchitic changes She is on chronic xarelto for " blood clot in vein "  Previously followed by Dr Clayborn Bigness. Reviewed his notes in Care EveryWhere. History of psychiatric issues and psychosis . He indicated bipolar disease. Noted history of PAF as reason for anticoagulation Was [facility] at [facility] 02/2017 for question of upgrade PPM to AICD but not thought necessary with echo showing normal EF Normal cath in 2010   She is unaware of current pacer type but says it was placed in April 2016 Old cards indicated previous pacers were [employer] She seems to think she will be at EOL in 4-5 months    ROS All other systems reviewed and negative except as noted above  Past Medical History:  Diagnosis Date  . Anxiety   . Benign neoplasm of ascending colon   . Benign neoplasm of cecum   . Benign neoplasm of transverse colon   . Blood clot in vein   . COPD (chronic obstructive pulmonary disease) ([place])   . Diabetes mellitus without complication ([place])   . Hypertension   . Ingrown nail 07/26/2014  . Paronychia 07/26/2014  . Personal history of colonic polyps   . Polyp of sigmoid colon   . Poor venous access 12/07/2014  . Thyroid  disease   . Vitamin D deficiency     No family history on file.  Social History   Socioeconomic History  . Marital status: Widowed    Spouse name: Not on file  . Number of children: Not on file  . Years of education: Not on file  . Highest education level: Not on file  Occupational History  . Not on file  Social Needs  . Financial resource strain: Not on file  . Food insecurity    Worry: Not on file    Inability: Not on file  . Transportation needs    Medical: Not on file    Non-medical: Not on file  Tobacco Use  . Smoking status: Former Smoker    Packs/day: 1.00    Years: 26.00    Pack years: 26.00    Types: Cigarettes    Quit date: 12/07/1988    Years since quitting: 30.0  . Smokeless tobacco: Never Used  Substance and Sexual Activity  . Alcohol use: No    Alcohol/week: 0.0 standard drinks  . Drug use: No  . Sexual activity: Not on file  Lifestyle  . Physical activity    Days per week: Not on file    Minutes per session: Not on file  . Stress: Not on file  Relationships  . Social Herbalist on phone: Not on file    Gets together:  Not on file    Attends religious service: Not on file    Active member of club or organization: Not on file    Attends meetings of clubs or organizations: Not on file    Relationship status: Not on file  . Intimate partner violence    Fear of current or ex partner: Not on file    Emotionally abused: Not on file    Physically abused: Not on file    Forced sexual activity: Not on file  Other Topics Concern  . Not on file  Social History Narrative  . Not on file    Past Surgical History:  Procedure Laterality Date  . ABDOMINAL HYSTERECTOMY    . CESAREAN SECTION    . COLONOSCOPY  2015  . COLONOSCOPY WITH PROPOFOL N/A 04/29/2017   Procedure: COLONOSCOPY WITH PROPOFOL;  Surgeon: Lucilla Lame, MD;  [facility] ENDOSCOPY;  Service: Endoscopy;  Laterality: N/A;  . ESOPHAGOGASTRODUODENOSCOPY (EGD) WITH PROPOFOL N/A  04/29/2017   Procedure: ESOPHAGOGASTRODUODENOSCOPY (EGD) WITH PROPOFOL;  Surgeon: Lucilla Lame, MD;  Location: [facility] ENDOSCOPY;  Service: Endoscopy;  Laterality: N/A;  . ESOPHAGOSCOPY WITH DILITATION    . ESOPHAGOSCOPY WITH DILITATION N/A 12/12/2014   Procedure: ESOPHAGOSCOPY WITH DILITATION;  Surgeon: Margaretha Sheffield, MD;  Location: [facility] ORS;  Service: ENT;  Laterality: N/A;  . INSERT / REPLACE / REMOVE PACEMAKER    . PACEMAKER INSERTION  1994  . PHARYNGEAL DILITATION    . PORT-A-CATH REMOVAL    . PORTACATH PLACEMENT    . [place]  . THYROIDECTOMY    . TRACHEOSTOMY  2014  . TUMOR REMOVAL          Physical Exam: Blood pressure (!) 160/88, pulse 79, height 5\' 7"  (1.702 m), weight 211 lb (95.7 kg), SpO2 92 %.   Bipolar  Chronically ill white female Bronchitic  HEENT: previous tracheostomy  Neck supple with no adenopathy JVP normal no bruits no thyromegaly Lungs clear with no wheezing and good diaphragmatic motion Heart:  S1/S2 no murmur, no rub, gallop or click PMI normal Pacer under right clavicle  Abdomen: benighn, BS positve, no tenderness, no AAA no bruit.  No HSM or HJR Distal pulses intact with no bruits No edema Neuro non-focal Skin warm and dry No muscular weakness   Labs:   Lab Results  Component Value Date   WBC 9.2 03/25/2017   HGB 16.1 (H) 03/25/2017   HCT 46.0 03/25/2017   MCV 86.1 03/25/2017   PLT 259 03/25/2017   No results for input(s): NA, K, CL, CO2, BUN, CREATININE, CALCIUM, PROT, BILITOT, ALKPHOS, ALT, AST, GLUCOSE in the last 168 hours.  Invalid input(s): LABALBU Lab Results  Component Value Date   TROPONINI <0.03 03/25/2017      Radiology: No results found.  EKG: SR rate 90 LVH 03/26/17 12/09/18 SR rate 79 LVH    ASSESSMENT AND PLAN:   1. PPM:  Will try to get records from [facility] refer to EP for f/u PPM 2. HLD continue statin  3. COPD:  With previous tracheostomy and Port A Cath refer to pulmonary at patient request   4. PAF:  On xarelto no bleeding issues in NSR today Interrogate pacer with EP to estimate time in NSR  5. HTN:  Well controlled.  Continue current medications and low sodium Dash type diet.    F/U can primarily be with EP ? Appears she may have also scheduled appointment with Dr Einar Gip    Signed: Jenkins Rouge  12/09/2018, 4:21 PM

## 2018-12-09 ENCOUNTER — Encounter: Payer: Self-pay | Admitting: Cardiovascular Disease

## 2018-12-09 ENCOUNTER — Other Ambulatory Visit: Payer: Self-pay

## 2018-12-09 ENCOUNTER — Ambulatory Visit (INDEPENDENT_AMBULATORY_CARE_PROVIDER_SITE_OTHER): Payer: Medicare Other | Admitting: Cardiovascular Disease

## 2018-12-09 VITALS — BP 160/88 | HR 79 | Ht 67.0 in | Wt 211.0 lb

## 2018-12-09 DIAGNOSIS — J449 Chronic obstructive pulmonary disease, unspecified: Secondary | ICD-10-CM | POA: Diagnosis not present

## 2018-12-09 DIAGNOSIS — Z950 Presence of cardiac pacemaker: Secondary | ICD-10-CM

## 2018-12-09 DIAGNOSIS — I480 Paroxysmal atrial fibrillation: Secondary | ICD-10-CM | POA: Diagnosis not present

## 2018-12-09 NOTE — Patient Instructions (Signed)
Medication Instructions:   *If you need a refill on your cardiac medications before your next appointment, please call your pharmacy*  Lab Work:  If you have labs (blood work) drawn today and your tests are completely normal, you will receive your results only by: Marland Kitchen MyChart Message (if you have MyChart) OR . A paper copy in the mail If you have any lab test that is abnormal or we need to change your treatment, we will call you to review the results.  Testing/Procedures: None ordered today.  Follow-Up: At [facility], you and your health needs are our priority.  As part of our continuing mission to provide you with exceptional heart care, we have created designated Provider Care Teams.  These Care Teams include your primary Cardiologist (physician) and Advanced Practice Providers (APPs -  Physician Assistants and Nurse Practitioners) who all work together to provide you with the care you need, when you need it.  Your next appointment:   12 months  The format for your next appointment:   In Person  Provider:   You may see Dr. Johnsie Cancel or one of the following Advanced Practice Providers on your designated Care Team:    Truitt Merle, NP  Cecilie Kicks, NP  Kathyrn Drown, NP  You have been referred to Dr. Lovena Le for pacemaker check- schedule as soon as possible.  You have been referred to Pulmonology for COPD.

## 2018-12-10 NOTE — Addendum Note (Signed)
Addended by: Jacinta Shoe on: 12/10/2018 09:38 AM   Modules accepted: Orders

## 2018-12-10 NOTE — Addendum Note (Signed)
Addended by: Jacinta Shoe on: 12/10/2018 10:13 AM   Modules accepted: Orders

## 2018-12-31 ENCOUNTER — Institutional Professional Consult (permissible substitution): Payer: Medicare Other | Admitting: Pulmonary Disease

## 2019-01-04 ENCOUNTER — Encounter

## 2019-01-04 ENCOUNTER — Encounter: Payer: Medicare Other | Admitting: Internal Medicine

## 2019-01-18 ENCOUNTER — Other Ambulatory Visit: Payer: Medicare Other

## 2019-01-21 ENCOUNTER — Ambulatory Visit: Payer: Medicare Other | Admitting: Internal Medicine

## 2019-01-21 ENCOUNTER — Other Ambulatory Visit: Payer: Self-pay

## 2019-01-21 DIAGNOSIS — Z950 Presence of cardiac pacemaker: Secondary | ICD-10-CM

## 2019-01-21 DIAGNOSIS — I495 Sick sinus syndrome: Secondary | ICD-10-CM

## 2019-01-21 NOTE — Patient Instructions (Addendum)

## 2019-01-21 NOTE — Progress Notes (Signed)
HPI Jessica Fisher is referred today by Dr. Johnsie Cancel for ongoing evaluation and management of sinus node dysfunction, s/p PPM insertion. She has a h/o dizzy spells and syncope. Review of her records suggests that she has had some problems with anxiety. She has a h/o blood clots and is on chronic xarelto. She was concerned that her PM was nearing ERI. She has seen multiple cardiology practices in the past including [facility], Dr. Clayborn Bigness and [facility].  Allergies  Allergen Reactions  . Lisinopril Itching  . Flagyl [Metronidazole] Itching  . Metformin And Related Itching  . Other Other (See Comments)    EMEDACON = unable to remember per pt  . Talwin [Pentazocine] Itching  . Tape Other (See Comments)    May use paper tape  . Biaxin [Clarithromycin] Itching  . Sulfa Antibiotics Itching     Current Outpatient Medications  Medication Sig Dispense Refill  . busPIRone (BUSPAR) 30 MG tablet Take 30 mg by mouth 2 (two) times daily.    . clonazePAM (KLONOPIN) 0.5 MG tablet Take 0.5 mg by mouth 2 (two) times daily as needed for anxiety.    Marland Kitchen diltiazem (CARDIZEM CD) 300 MG 24 hr capsule Take 300 mg by mouth every evening.     . Insulin Pen Needle (EASY TOUCH PEN NEEDLES) 31G X 8 MM MISC     . insulin regular human CONCENTRATED (HUMULIN R) 500 UNIT/ML injection Inject 50 Units into the skin 3 (three) times daily with meals.     . Insulin Syringe-Needle U-100 (BD INSULIN SYRINGE ULTRAFINE) 31G X 5/16" 1 ML MISC     . isosorbide mononitrate (IMDUR) 60 MG 24 hr tablet Take 60 mg by mouth daily.    Marland Kitchen ketoconazole (NIZORAL) 2 % cream Apply 1 application topically.    Marland Kitchen levothyroxine (SYNTHROID) 125 MCG tablet Take 125 mcg by mouth 2 (two) times daily.     Marland Kitchen linaclotide (LINZESS) 145 MCG CAPS capsule Take 145 mcg by mouth daily before breakfast.    . metolazone (ZAROXOLYN) 5 MG tablet Take 5 mg by mouth daily. As needed    . mirabegron ER (MYRBETRIQ) 25 MG TB24 tablet Take 25 mg by mouth daily.    .  Morphine Sulfate ER 30 MG T12A Take 30 mg by mouth 2 (two) times daily.     . nitroGLYCERIN (NITRODUR - DOSED IN MG/24 HR) 0.4 mg/hr patch Place 0.4 mg onto the skin.    Marland Kitchen nitroGLYCERIN (NITROSTAT) 0.4 MG SL tablet Place 0.4 mg under the tongue every 5 (five) minutes as needed.     Marland Kitchen omeprazole (PRILOSEC) 40 MG capsule Take 40 mg by mouth 2 (two) times daily.     . Oxycodone HCl 10 MG TABS Take 10 mg by mouth 2 (two) times daily.     . pregabalin (LYRICA) 300 MG capsule Take 300 mg by mouth daily.     . QUEtiapine (SEROQUEL) 50 MG tablet Take 50 mg by mouth.     . rivaroxaban (XARELTO) 20 MG TABS tablet Take 20 mg by mouth daily with supper.     . rosuvastatin (CRESTOR) 10 MG tablet Take 10 mg by mouth at bedtime.     Nelva Nay SOLOSTAR 300 UNIT/ML SOPN Inject 50 Units into the skin daily.      No current facility-administered medications for this visit.     Past Medical History:  Diagnosis Date  . Anxiety   . Benign neoplasm of ascending colon   . Benign  neoplasm of cecum   . Benign neoplasm of transverse colon   . Blood clot in vein   . COPD (chronic obstructive pulmonary disease) ([place])   . Diabetes mellitus without complication ([place])   . Hypertension   . Ingrown nail 07/26/2014  . Paronychia 07/26/2014  . Personal history of colonic polyps   . Polyp of sigmoid colon   . Poor venous access 12/07/2014  . Thyroid disease   . Vitamin D deficiency     ROS:   All systems reviewed and negative except as noted in the HPI.   Past Surgical History:  Procedure Laterality Date  . ABDOMINAL HYSTERECTOMY    . CESAREAN SECTION    . COLONOSCOPY  2015  . COLONOSCOPY WITH PROPOFOL N/A 04/29/2017   Procedure: COLONOSCOPY WITH PROPOFOL;  Surgeon: Lucilla Lame, MD;  Location: [facility] ENDOSCOPY;  Service: Endoscopy;  Laterality: N/A;  . ESOPHAGOGASTRODUODENOSCOPY (EGD) WITH PROPOFOL N/A 04/29/2017   Procedure: ESOPHAGOGASTRODUODENOSCOPY (EGD) WITH PROPOFOL;  Surgeon: Lucilla Lame, MD;  Location:  [facility] ENDOSCOPY;  Service: Endoscopy;  Laterality: N/A;  . ESOPHAGOSCOPY WITH DILITATION    . ESOPHAGOSCOPY WITH DILITATION N/A 12/12/2014   Procedure: ESOPHAGOSCOPY WITH DILITATION;  Surgeon: Margaretha Sheffield, MD;  Location: [facility] ORS;  Service: ENT;  Laterality: N/A;  . INSERT / REPLACE / REMOVE PACEMAKER    . PACEMAKER INSERTION  1994  . PHARYNGEAL DILITATION    . PORT-A-CATH REMOVAL    . PORTACATH PLACEMENT    . [place]  . THYROIDECTOMY    . TRACHEOSTOMY  2014  . TUMOR REMOVAL       No family history on file.   Social History   Socioeconomic History  . Marital status: Widowed    Spouse name: Not on file  . Number of children: Not on file  . Years of education: Not on file  . Highest education level: Not on file  Occupational History  . Not on file  Tobacco Use  . Smoking status: Former Smoker    Packs/day: 1.00    Years: 26.00    Pack years: 26.00    Types: Cigarettes    Quit date: 12/07/1988    Years since quitting: 30.1  . Smokeless tobacco: Never Used  Substance and Sexual Activity  . Alcohol use: No    Alcohol/week: 0.0 standard drinks  . Drug use: No  . Sexual activity: Not on file  Other Topics Concern  . Not on file  Social History Narrative  . Not on file   Social Determinants of Health   Financial Resource Strain:   . Difficulty of Paying Living Expenses: Not on file  Food Insecurity:   . Worried About Charity fundraiser in the Last Year: Not on file  . Ran Out of Food in the Last Year: Not on file  Transportation Needs:   . Lack of Transportation (Medical): Not on file  . Lack of Transportation (Non-Medical): Not on file  Physical Activity:   . Days of Exercise per Week: Not on file  . Minutes of Exercise per Session: Not on file  Stress:   . Feeling of Stress : Not on file  Social Connections:   . Frequency of Communication with Friends and Family: Not on file  . Frequency of Social Gatherings with Friends and Family: Not on file   . Attends Religious Services: Not on file  . Active Member of Clubs or Organizations: Not on file  . Attends Archivist Meetings:  Not on file  . Marital Status: Not on file  Intimate Partner Violence:   . Fear of Current or Ex-Partner: Not on file  . Emotionally Abused: Not on file  . Physically Abused: Not on file  . Sexually Abused: Not on file     BP (!) 155/87   Pulse 70   Ht 5\' 7"  (1.702 m)   Wt 213 lb 3.2 oz (96.7 kg)   SpO2 97%   BMI 33.39 kg/m   Physical Exam:  Well appearing 68 yo woman, NAD HEENT: Unremarkable Neck:  6 cm JVD, no thyromegally Lymphatics:  No adenopathy Back:  No CVA tenderness Lungs:  Clear with no wheezes HEART:  Regular rate rhythm, no murmurs, no rubs, no clicks Abd:  soft, positive bowel sounds, no organomegally, no rebound, no guarding Ext:  2 plus pulses, no edema, no cyanosis, no clubbing Skin:  No rashes no nodules Neuro:  CN II through XII intact, motor grossly intact   DEVICE  Normal device function.  See PaceArt for details.   Assess/Plan: 1. Sinus node dysfunction - she is pacing about 50% of the time and appears to be asymptomatic.  2.dizzy spells - orthostatic vitals were obtained today and were normal with no bp drop of significance.  3. PPM - interrogation of her DDD St. Jude PPM demonstrates normal device function. She has at least 8 years of battery longevity. 4. PAF - she has not had any recent episodes. She will continue her current meds.  Mikle Bosworth.D.

## 2019-03-24 ENCOUNTER — Ambulatory Visit (INDEPENDENT_AMBULATORY_CARE_PROVIDER_SITE_OTHER): Payer: Medicare Other | Admitting: *Deleted

## 2019-03-24 DIAGNOSIS — Z950 Presence of cardiac pacemaker: Secondary | ICD-10-CM

## 2019-03-24 LAB — CUP PACEART REMOTE DEVICE CHECK
Battery Remaining Longevity: 111 mo
Battery Remaining Percentage: 95.5 %
Battery Voltage: 2.98 V
Brady Statistic AP VP Percent: 1 %
Brady Statistic AP VS Percent: 59 %
Brady Statistic AS VP Percent: 1 %
Brady Statistic AS VS Percent: 40 %
Brady Statistic RA Percent Paced: 58 %
Brady Statistic RV Percent Paced: 1 %
Date Time Interrogation Session: 20210224020032
Implantable Lead Implant Date: 20160427
Implantable Lead Implant Date: 20160427
Implantable Lead Location: 753859
Implantable Lead Location: 753860
Implantable Lead Model: 5076
Implantable Pulse Generator Implant Date: 20160427
Lead Channel Impedance Value: 490 Ohm
Lead Channel Impedance Value: 510 Ohm
Lead Channel Pacing Threshold Amplitude: 1 V
Lead Channel Pacing Threshold Amplitude: 1.25 V
Lead Channel Pacing Threshold Pulse Width: 0.4 ms
Lead Channel Pacing Threshold Pulse Width: 0.5 ms
Lead Channel Sensing Intrinsic Amplitude: 1.6 mV
Lead Channel Sensing Intrinsic Amplitude: 7 mV
Lead Channel Setting Pacing Amplitude: 2.5 V
Lead Channel Setting Pacing Amplitude: 2.5 V
Lead Channel Setting Pacing Pulse Width: 0.5 ms
Lead Channel Setting Sensing Sensitivity: 2 mV
Pulse Gen Model: 2240
Pulse Gen Serial Number: 7763893

## 2019-03-24 NOTE — Progress Notes (Signed)
PPM Remote  

## 2019-04-15 IMAGING — CT CT NECK W/ CM
4 of 5 series · 14 of 33 positions shown, 16 images · IV contrast (iopamidol)
Comparison: CT neck 06/06/2009

CLINICAL DATA: Right anterior neck soft tissue mass. Chronic
sialadenitis. Multiple previous neck surgeries. Dysphagia.

EXAM:
CT NECK WITH CONTRAST
TECHNIQUE: Multidetector CT imaging of the neck was performed using the
standard protocol following the bolus administration of intravenous
contrast.
CONTRAST:  75mL [id number] IOPAMIDOL ([id number]) INJECTION 61%

[Series 2: axial neck · axial · 0.49mm/px · z∈[+104,+164]mm · 2 of 122 slices shown]
[im 31/122  bone]
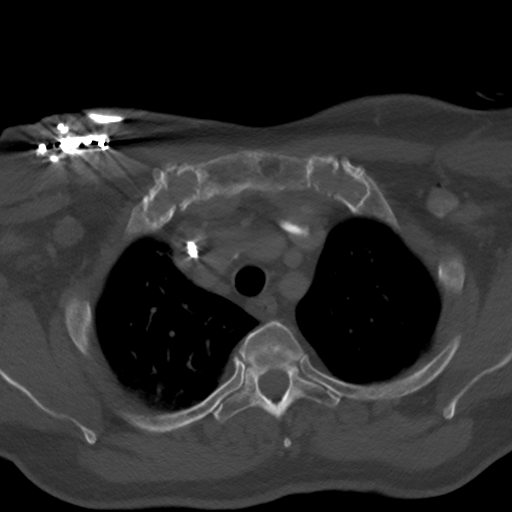
[im 61/122  bone]
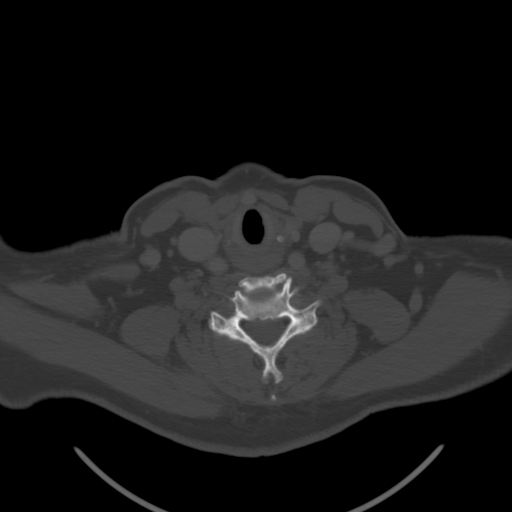

[Series 6: sag neck · sagittal · 0.51mm/px · 5 of 83 slices shown, 6 images]
[im 28/83  bone]
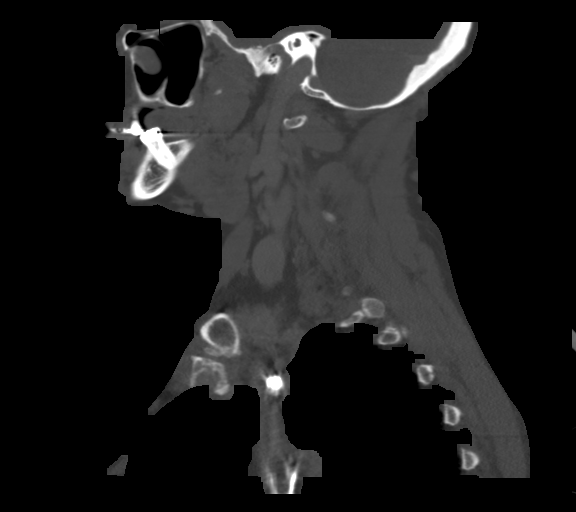
[im 35/83  bone]
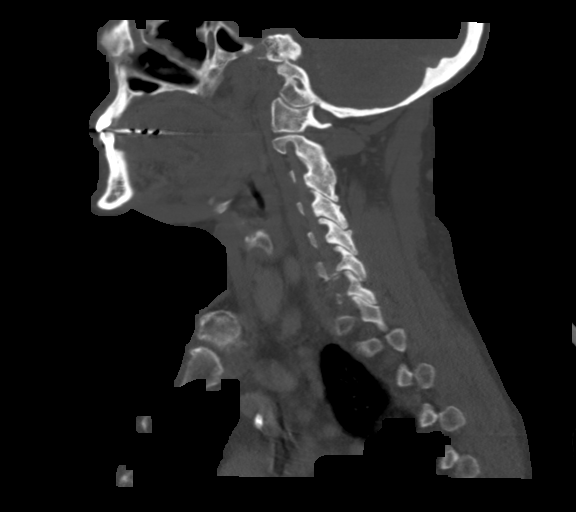
[im 42/83  soft-tissue]
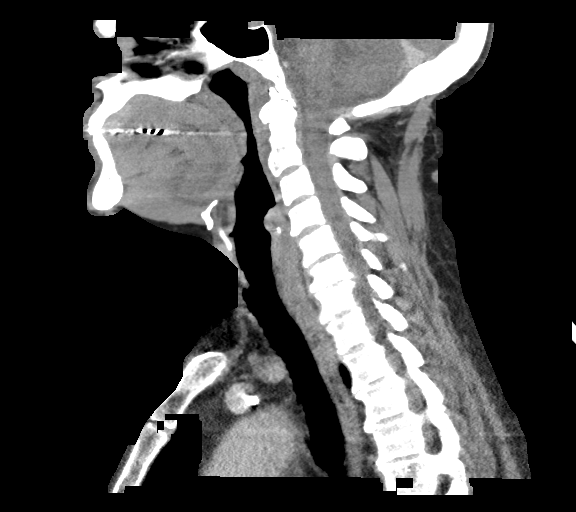
[im 42/83  bone]
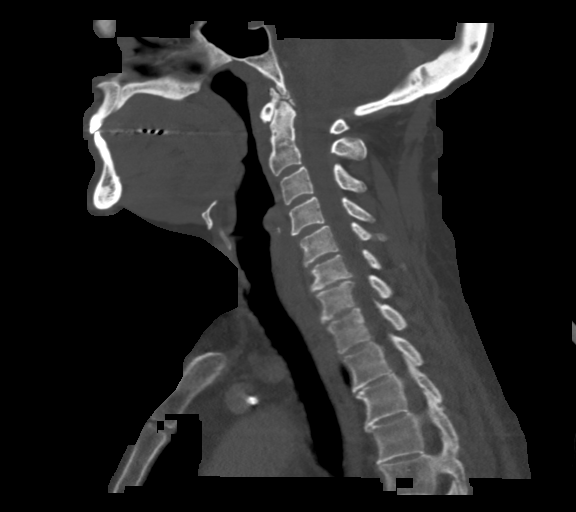
[im 48/83  bone]
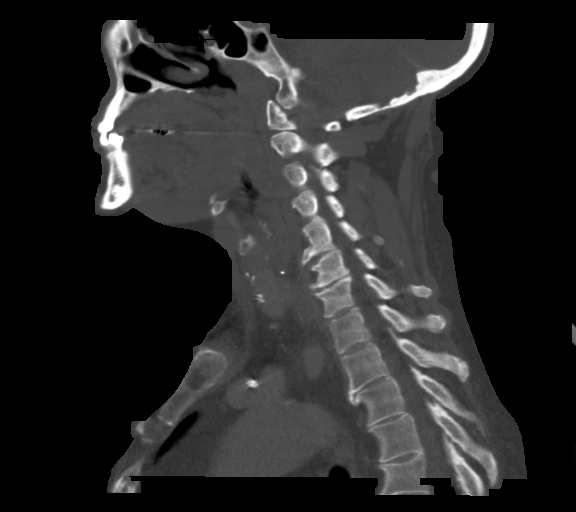
[im 55/83  bone]
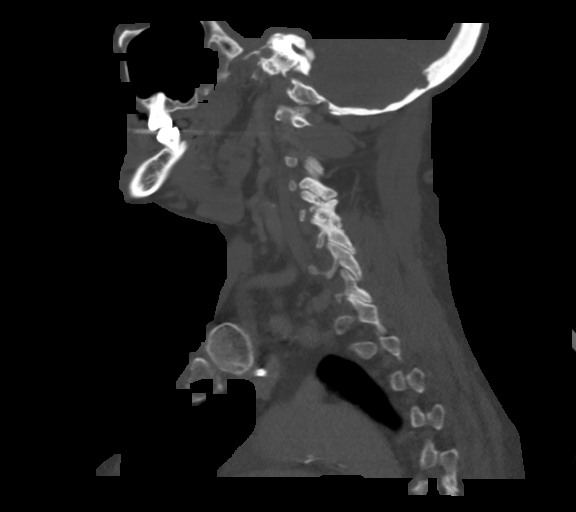

[Series 7: cor neck · coronal · 0.41mm/px · 3 of 129 slices shown]
[im 26/129  bone]
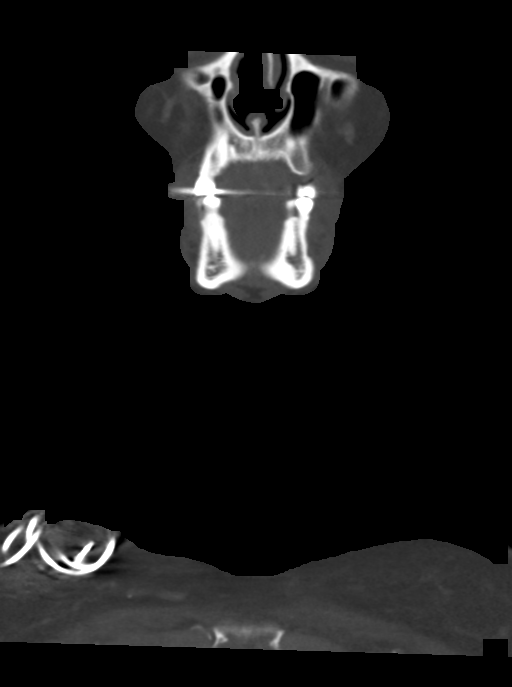
[im 52/129  bone]
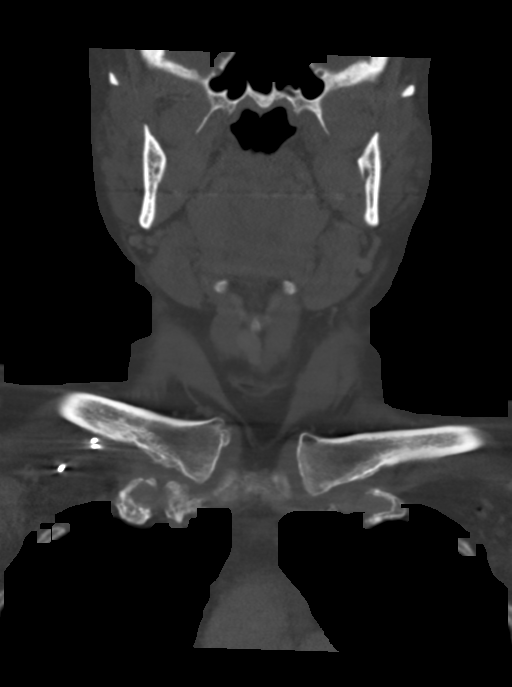
[im 77/129  bone]
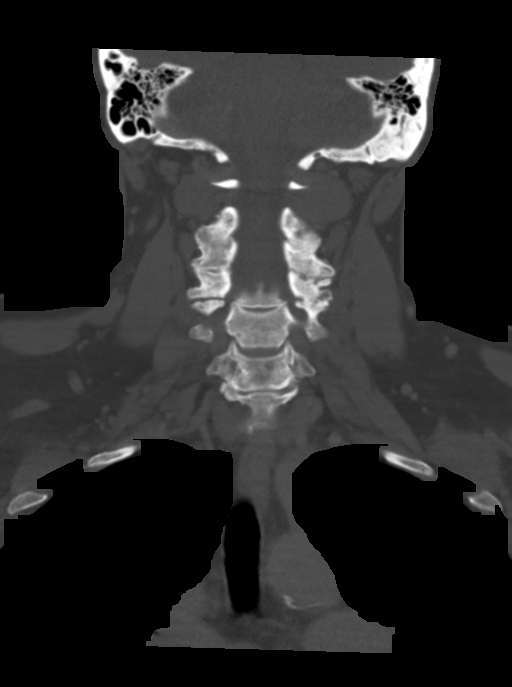

[Series 8: orthogonal ax · axial · 0.49mm/px · z∈[+48,+209]mm · 4 of 139 slices shown, 5 images]
[im 28/139  soft-tissue]
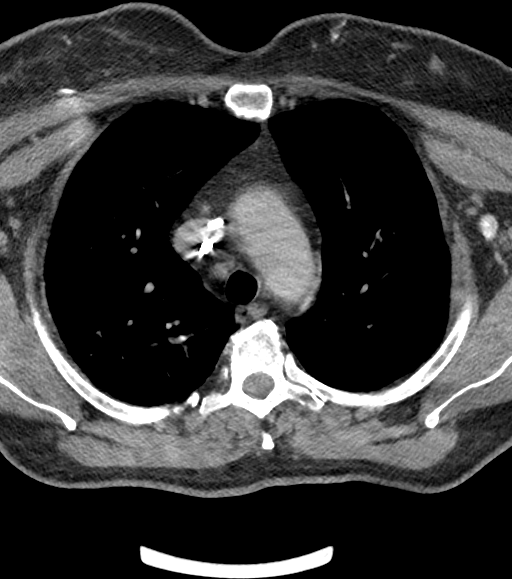
[im 28/139  bone]
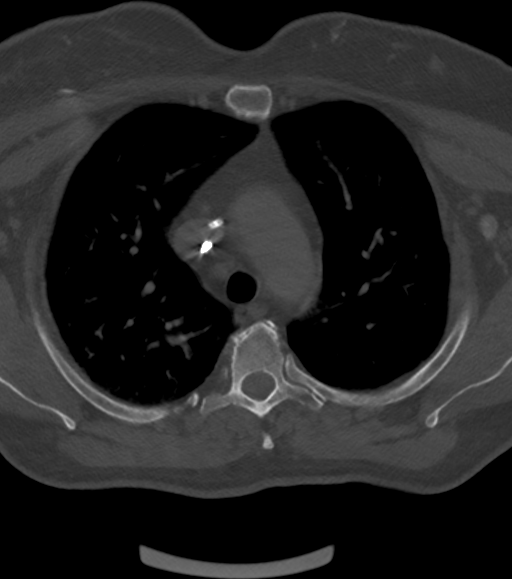
[im 56/139  bone]
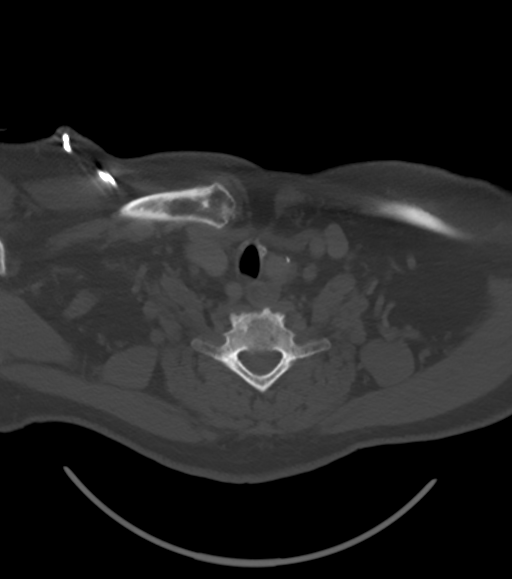
[im 83/139  bone]
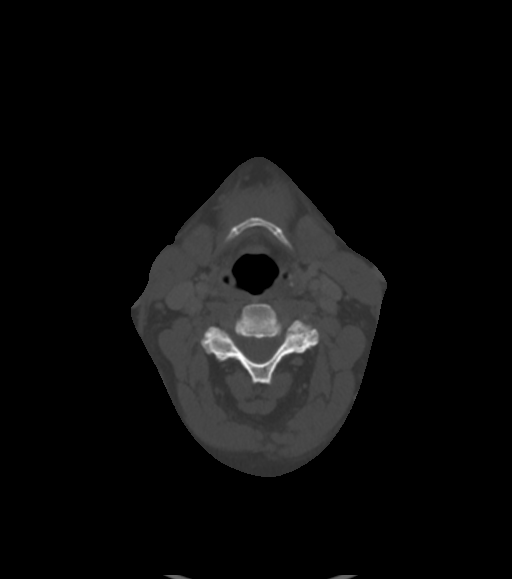
[im 111/139  bone]
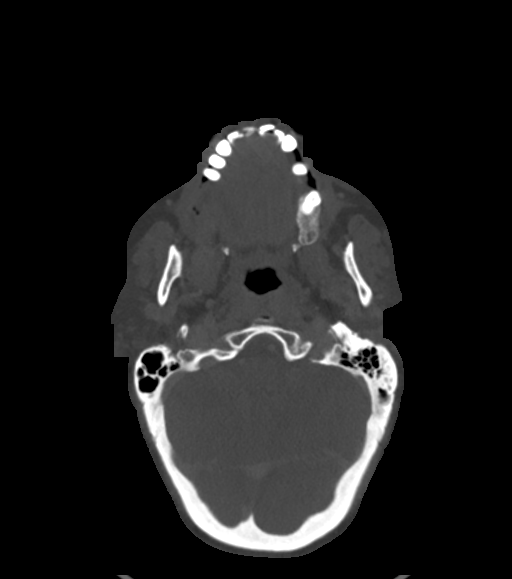

[14 of 33 positions shown; findings below may reference images not displayed]

FINDINGS: Pharynx and larynx: No focal mucosal or submucosal lesions are
present. The nasopharynx and oropharynx are within normal limits.
The tongue base is within normal limits. The epiglottis is within
normal limits. Hypopharynx is clear. Vocal cords are midline and
symmetric.

Salivary glands: The submandibular and parotid glands are within
normal limits bilaterally. No focal enhancement or duct dilation is
present.

Thyroid: Thyroidectomy is noted. There is focal tissue consistent
with thyroid just anterior to the thyroid cartilage measuring 7 x 8
x 14 mm. This likely represents some residual gland. Of note, this
was not present on the previous study following left thyroidectomy.

There is significant interval growth of thyroid tissue on the left
which now measures 18 x 17 x 25 mm.

Lymph nodes: No significant cervical adenopathy is present.

Vascular: Atherosclerotic changes are noted at the carotid
bifurcations bilaterally without a significant stenosis.

Limited intracranial: Within normal limits.

Visualized orbits: Inferior orbits are unremarkable.

Mastoids and visualized paranasal sinuses: The paranasal sinuses and
mastoid air cells are clear.

Skeleton: Grade 1 degenerative anterolisthesis present at C4-5.
Chronic endplate changes are present at C5-6 and to a greater degree
at C6-7. Osseous foraminal narrowing is present bilaterally at C6-7
secondary to uncovertebral spurring.

Upper chest: A polyp or mucous retention cyst is again noted
anteriorly in the right maxillary sinus. The remaining paranasal
sinuses and mastoid air cells are clear.

Other:
IMPRESSION: 1. Recurrent thyroid tissue in the left thyroidectomy bed and
anterior to the thyroid cartilage. In this patient with known
thyroid disease, of this raises concern for neoplasm.
2. No significant adenopathy.
3. No significant salivary gland disease.
4. Degenerative changes of the cervical spine as described.

## 2019-04-20 ENCOUNTER — Emergency Department (HOSPITAL_COMMUNITY)
Admission: EM | Admit: 2019-04-20 | Discharge: 2019-04-21 | Disposition: A | Discharge status: Home / Self Care | Payer: Medicare Other | Attending: Emergency Medicine | Admitting: Emergency Medicine

## 2019-04-20 ENCOUNTER — Encounter (HOSPITAL_COMMUNITY): Payer: Self-pay | Admitting: Emergency Medicine

## 2019-04-20 ENCOUNTER — Other Ambulatory Visit: Payer: Self-pay

## 2019-04-20 DIAGNOSIS — Z87891 Personal history of nicotine dependence: Secondary | ICD-10-CM | POA: Diagnosis not present

## 2019-04-20 DIAGNOSIS — J449 Chronic obstructive pulmonary disease, unspecified: Secondary | ICD-10-CM | POA: Insufficient documentation

## 2019-04-20 DIAGNOSIS — Z951 Presence of aortocoronary bypass graft: Secondary | ICD-10-CM | POA: Insufficient documentation

## 2019-04-20 DIAGNOSIS — R0789 Other chest pain: Secondary | ICD-10-CM | POA: Diagnosis not present

## 2019-04-20 DIAGNOSIS — E278 Other specified disorders of adrenal gland: Secondary | ICD-10-CM | POA: Diagnosis not present

## 2019-04-20 DIAGNOSIS — R197 Diarrhea, unspecified: Secondary | ICD-10-CM | POA: Insufficient documentation

## 2019-04-20 DIAGNOSIS — E079 Disorder of thyroid, unspecified: Secondary | ICD-10-CM | POA: Insufficient documentation

## 2019-04-20 DIAGNOSIS — Z79899 Other long term (current) drug therapy: Secondary | ICD-10-CM | POA: Diagnosis not present

## 2019-04-20 DIAGNOSIS — R1084 Generalized abdominal pain: Secondary | ICD-10-CM | POA: Insufficient documentation

## 2019-04-20 DIAGNOSIS — Z20822 Contact with and (suspected) exposure to covid-19: Secondary | ICD-10-CM | POA: Diagnosis not present

## 2019-04-20 DIAGNOSIS — R110 Nausea: Secondary | ICD-10-CM | POA: Diagnosis not present

## 2019-04-20 LAB — COMPREHENSIVE METABOLIC PANEL
ALT: 26 U/L (ref 0–44)
AST: 25 U/L (ref 15–41)
Albumin: 4.1 g/dL (ref 3.5–5.0)
Alkaline Phosphatase: 118 U/L (ref 38–126)
Anion gap: 12 (ref 5–15)
BUN: 11 mg/dL (ref 8–23)
CO2: 22 mmol/L (ref 22–32)
Calcium: 9.5 mg/dL (ref 8.9–10.3)
Chloride: 102 mmol/L (ref 98–111)
Creatinine, Ser: 0.91 mg/dL (ref 0.44–1.00)
GFR calc Af Amer: >60 mL/min (ref >60)
GFR calc non Af Amer: >60 mL/min (ref >60)
Glucose, Bld: 287 mg/dL — ABNORMAL HIGH (ref 70–99)
Potassium: 3.9 mmol/L (ref 3.5–5.1)
Sodium: 136 mmol/L (ref 135–145)
Total Bilirubin: 0.8 mg/dL (ref 0.3–1.2)
Total Protein: 7.5 g/dL (ref 6.5–8.1)

## 2019-04-20 LAB — URINALYSIS, ROUTINE W REFLEX MICROSCOPIC
Bacteria, UA: NONE SEEN
Bilirubin Urine: NEGATIVE
Glucose, UA: >=500 mg/dL — AB
Hgb urine dipstick: NEGATIVE
Ketones, ur: NEGATIVE mg/dL
Nitrite: NEGATIVE
Protein, ur: NEGATIVE mg/dL
Specific Gravity, Urine: 1.033 — ABNORMAL HIGH (ref 1.005–1.030)
pH: 5 (ref 5.0–8.0)

## 2019-04-20 LAB — CBC
HCT: 45.1 % (ref 36.0–46.0)
Hemoglobin: 14.8 g/dL (ref 12.0–15.0)
MCH: 29.5 pg (ref 26.0–34.0)
MCHC: 32.8 g/dL (ref 30.0–36.0)
MCV: 90 fL (ref 80.0–100.0)
Platelets: 261 10*3/uL (ref 150–400)
RBC: 5.01 MIL/uL (ref 3.87–5.11)
RDW: 15.3 % (ref 11.5–15.5)
WBC: 10.9 10*3/uL — ABNORMAL HIGH (ref 4.0–10.5)
nRBC: 0 % (ref 0.0–0.2)

## 2019-04-20 LAB — LIPASE, BLOOD: Lipase: 18 U/L (ref 11–51)

## 2019-04-20 MED ORDER — SODIUM CHLORIDE 0.9% FLUSH
3.0000 mL | Freq: Once | INTRAVENOUS | Status: AC
  Administered 2019-04-21: 3 mL via INTRAVENOUS

## 2019-04-20 NOTE — ED Triage Notes (Signed)
Pt states she has been having abd cramping and "75 bowel movements since last Wednesday." Pt complains of vomiting/diarrhea and nausea.

## 2019-04-21 ENCOUNTER — Emergency Department (HOSPITAL_COMMUNITY): Payer: Medicare Other

## 2019-04-21 LAB — ETHANOL: Alcohol, Ethyl (B): <10 mg/dL (ref <10)

## 2019-04-21 LAB — RAPID URINE DRUG SCREEN, HOSP PERFORMED
Amphetamines: NOT DETECTED
Barbiturates: NOT DETECTED
Benzodiazepines: NOT DETECTED
Cocaine: NOT DETECTED
Opiates: NOT DETECTED
Tetrahydrocannabinol: NOT DETECTED

## 2019-04-21 LAB — RESPIRATORY PANEL BY RT PCR (FLU A&B, COVID)
Influenza A by PCR: NEGATIVE
Influenza B by PCR: NEGATIVE
SARS Coronavirus 2 by RT PCR: NEGATIVE

## 2019-04-21 LAB — DIGOXIN LEVEL: Digoxin Level: 0.3 ng/mL — ABNORMAL LOW (ref 0.8–2.0)

## 2019-04-21 LAB — CBG MONITORING, ED: Glucose-Capillary: 191 mg/dL — ABNORMAL HIGH (ref 70–99)

## 2019-04-21 LAB — TSH: TSH: 2.53 u[IU]/mL (ref 0.350–4.500)

## 2019-04-21 MED ORDER — IOHEXOL 300 MG/ML  SOLN
100.0000 mL | Freq: Once | INTRAMUSCULAR | Status: AC | PRN
  Administered 2019-04-21: 100 mL via INTRAVENOUS

## 2019-04-21 MED ORDER — LEVOTHYROXINE SODIUM 50 MCG PO TABS
250.0000 ug | ORAL_TABLET | Freq: Every day | ORAL | Status: DC
  Administered 2019-04-21: 250 ug via ORAL
  Filled 2019-04-21: qty 2

## 2019-04-21 MED ORDER — OXYCODONE-ACETAMINOPHEN 5-325 MG PO TABS
2.0000 | ORAL_TABLET | Freq: Once | ORAL | Status: AC
  Administered 2019-04-21: 2 via ORAL
  Filled 2019-04-21: qty 2

## 2019-04-21 MED ORDER — INSULIN ASPART 100 UNIT/ML ~~LOC~~ SOLN
0.0000 [IU] | Freq: Three times a day (TID) | SUBCUTANEOUS | Status: DC
  Administered 2019-04-21: 3 [IU] via SUBCUTANEOUS

## 2019-04-21 MED ORDER — QUETIAPINE FUMARATE ER 50 MG PO TB24
50.0000 mg | ORAL_TABLET | Freq: Every day | ORAL | Status: DC
  Filled 2019-04-21: qty 1

## 2019-04-21 MED ORDER — RIVAROXABAN 20 MG PO TABS
20.0000 mg | ORAL_TABLET | Freq: Every day | ORAL | Status: DC
  Administered 2019-04-21: 20 mg via ORAL
  Filled 2019-04-21: qty 1

## 2019-04-21 MED ORDER — DIGOXIN 125 MCG PO TABS
0.1250 mg | ORAL_TABLET | Freq: Once | ORAL | Status: AC
  Administered 2019-04-21: 0.125 mg via ORAL
  Filled 2019-04-21: qty 1

## 2019-04-21 MED ORDER — BUSPIRONE HCL 10 MG PO TABS
15.0000 mg | ORAL_TABLET | Freq: Two times a day (BID) | ORAL | Status: DC
  Administered 2019-04-21: 15 mg via ORAL
  Filled 2019-04-21: qty 2

## 2019-04-21 MED ORDER — INSULIN GLARGINE 100 UNIT/ML ~~LOC~~ SOLN
35.0000 [IU] | Freq: Two times a day (BID) | SUBCUTANEOUS | Status: DC
  Administered 2019-04-21: 35 [IU] via SUBCUTANEOUS
  Filled 2019-04-21 (×2): qty 0.35

## 2019-04-21 MED ORDER — DROPERIDOL 2.5 MG/ML IJ SOLN
2.5000 mg | Freq: Once | INTRAMUSCULAR | Status: AC
  Administered 2019-04-21: 2.5 mg via INTRAVENOUS
  Filled 2019-04-21: qty 2

## 2019-04-21 MED ORDER — DILTIAZEM HCL ER COATED BEADS 300 MG PO CP24
300.0000 mg | ORAL_CAPSULE | Freq: Once | ORAL | Status: AC
  Administered 2019-04-21: 300 mg via ORAL
  Filled 2019-04-21: qty 1

## 2019-04-21 NOTE — ED Notes (Signed)
Walked patient to the bathroom patient did well 

## 2019-04-21 NOTE — BH Assessment (Signed)
Tele Assessment Note   Patient Name: Jessica Fisher MRN: [id number] Referring Physician: EDP Location of Patient: [facility] Location of Provider: Behavioral Health TTS Department  SHAKIA BRUCH is a 69 y.o. female who presented to [facility] on voluntary basis with complaint of food poisoning.  While in the hospital, Pt appeared to engage in pressured speech.  Per hospital report, ''69 year old female with history of COPD, diabetes, hypertension, bipolar disorder who presents with reported diarrhea.  Patient reports over "75 bowel movements" over the last 2 days.  She reports runny green stools.  No blood.  She has had nausea without vomiting.  She reports diffuse "25 out of 10" abdominal pain.  She has not noted any fevers.  She also reports ongoing unchanged chest pain which she has "all the time."  She reports that she was admitted to hospital in [facility] 2 weeks ago for "a mental breakdown" as well as chest pain.''  Pt said she lives alone in [place], is independent, and is starting treatment at [facility] following discharge from [place]  Pt was assessed.  She stated that she feels ''like crap'' due to food poisoning.  Pt described her recent stay at [facility] as ''getting help for a nervous breakdown... I had a lot of diarrhea and chest pain... but when I went there, I felt the love of Jesus.''  Pt could not elaborate on ''nervous breakdown.''  She denied suicidal ideation, homicidal ideation, hallucination, self-injurious behavior, and substance use concerns.  ''I wouldn't do those things.  I love the world.''  Author observed that Pt was preoccupied with food poisoning and also with conveying God's love.  Per chart review, Pt was admitted to [facility] in 2020 for a Bipolar episode.  At that time, Pt presented with manic symptoms and somatic delusion.  During that admission, Pt said that she has been treated by many psychiatrists.  Author spoke with Pt's son Shanon Brow for  collateral.  Per son, Pt has a history of Bipolar Disorder and frequently endorses somatic delusion (including frequent complaints about food poisoning and heart conditions).  Per son, Pt has not attempted suicide, but she frequently refers to committing suicide.  ''She got out of [facility] last week, and I think she was discharged too soon.''  Son does not know if Pt is compliant with medication.  During assessment, Pt presented as alert and oriented.  She had good eye contact and was cooperative.  Pt was gowned, and she appeared appropriately groomed.  Pt's mood and affect were preoccupied.  Speech was rapid and loud.  Pt's thought processes were within normal range. Pt responded to questions asked.  Thought content suggested religious preoccupation.  Pt's memory and concentration were fair.  Insight, judgment, and impulse control were fair.  Consulted with Nicholos Johns, NP, who determined that Pt does not meet inpatient criteria and may be psych-cleared.  Advised attending RN.  Diagnosis: Bipolar I  Past Medical History:  Past Medical History:  Diagnosis Date  . Anxiety   . Benign neoplasm of ascending colon   . Benign neoplasm of cecum   . Benign neoplasm of transverse colon   . Blood clot in vein   . COPD (chronic obstructive pulmonary disease) ([place])   . Diabetes mellitus without complication ([place])   . Hypertension   . Ingrown nail 07/26/2014  . Paronychia 07/26/2014  . Personal history of colonic polyps   . Polyp of sigmoid colon   . Poor venous access 12/07/2014  .  Thyroid disease   . Vitamin D deficiency     Past Surgical History:  Procedure Laterality Date  . ABDOMINAL HYSTERECTOMY    . CESAREAN SECTION    . COLONOSCOPY  2015  . COLONOSCOPY WITH PROPOFOL N/A 04/29/2017   Procedure: COLONOSCOPY WITH PROPOFOL;  Surgeon: Lucilla Lame, MD;  Location: [facility] ENDOSCOPY;  Service: Endoscopy;  Laterality: N/A;  . ESOPHAGOGASTRODUODENOSCOPY (EGD) WITH PROPOFOL N/A 04/29/2017   Procedure:  ESOPHAGOGASTRODUODENOSCOPY (EGD) WITH PROPOFOL;  Surgeon: Lucilla Lame, MD;  Location: [facility] ENDOSCOPY;  Service: Endoscopy;  Laterality: N/A;  . ESOPHAGOSCOPY WITH DILITATION    . ESOPHAGOSCOPY WITH DILITATION N/A 12/12/2014   Procedure: ESOPHAGOSCOPY WITH DILITATION;  Surgeon: Margaretha Sheffield, MD;  Location: [facility] ORS;  Service: ENT;  Laterality: N/A;  . INSERT / REPLACE / REMOVE PACEMAKER    . PACEMAKER INSERTION  1994  . PHARYNGEAL DILITATION    . PORT-A-CATH REMOVAL    . PORTACATH PLACEMENT    . [place]  . THYROIDECTOMY    . TRACHEOSTOMY  2014  . TUMOR REMOVAL      Family History: No family history on file.  Social History:  reports that she quit smoking about 30 years ago. Her smoking use included cigarettes. She has a 26.00 pack-year smoking history. She has never used smokeless tobacco. She reports that she does not drink alcohol or use drugs.  Additional Social History:  Alcohol / Drug Use Pain Medications: See MAR Prescriptions: See MAR Over the Counter: See MAR History of alcohol / drug use?: No history of alcohol / drug abuse  CIWA: CIWA-Ar BP: (!) 163/81 Pulse Rate: 73 COWS:    Allergies:  Allergies  Allergen Reactions  . Lisinopril Itching  . Flagyl [Metronidazole] Itching  . Metformin And Related Itching  . Other Other (See Comments)    EMEDACON = unable to remember per pt  . Talwin [Pentazocine] Itching  . Tape Other (See Comments)    May use paper tape  . Biaxin [Clarithromycin] Itching  . Sulfa Antibiotics Itching    Home Medications: (Not in a hospital admission)   OB/GYN Status:  No LMP recorded. Patient has had a hysterectomy.  General Assessment Data Location of Assessment: [facility] ED TTS Assessment: In system Is this a Tele or Face-to-Face Assessment?: Tele Assessment Is this an Initial Assessment or a Re-assessment for this encounter?: Initial Assessment Language Other than English: No Living Arrangements: Other (Comment) What gender  do you identify as?: Female Marital status: Single Pregnancy Status: No Living Arrangements: Alone Can pt return to current living arrangement?: Yes Admission Status: Voluntary Is patient capable of signing voluntary admission?: Yes Referral Source: Self/Family/Friend Insurance type: Garment/textile technologist     Crisis Care Plan Living Arrangements: Alone Legal Guardian: Other:(Self) Name of Psychiatrist: [facility] Name of Therapist: [facility]  Education Status Is patient currently in school?: No Is the patient employed, unemployed or receiving disability?: Unemployed  Risk to self with the past 6 months Suicidal Ideation: No Has patient been a risk to self within the past 6 months prior to admission? : No Suicidal Intent: No Has patient had any suicidal intent within the past 6 months prior to admission? : No Is patient at risk for suicide?: No Suicidal Plan?: No Has patient had any suicidal plan within the past 6 months prior to admission? : No Access to Means: No What has been your use of drugs/alcohol within the last 12 months?: Denied Previous Attempts/Gestures: No Intentional Self Injurious Behavior: None Family Suicide  History: Unknown Recent stressful life event(s): Recent negative physical changes(''food poisoning'') Persecutory voices/beliefs?: No Depression: Yes Depression Symptoms: Insomnia Substance abuse history and/or treatment for substance abuse?: No Suicide prevention information given to non-admitted patients: Not applicable  Risk to Others within the past 6 months Homicidal Ideation: No Does patient have any lifetime risk of violence toward others beyond the six months prior to admission? : No Thoughts of Harm to Others: No Current Homicidal Intent: No Current Homicidal Plan: No Access to Homicidal Means: No History of harm to others?: No Assessment of Violence: None Noted Does patient have access to weapons?: No Criminal Charges Pending?: No Does patient  have a court date: No Is patient on probation?: No  Psychosis Hallucinations: None noted Delusions: (Possible religious preoccupation)  Mental Status Report Appearance/Hygiene: In hospital gown, Unremarkable Eye Contact: Good Motor Activity: Freedom of movement, Unremarkable Speech: Rapid, Loud Level of Consciousness: Alert Mood: Preoccupied Affect: Preoccupied Anxiety Level: None Thought Processes: Coherent, Relevant Judgement: Partial Orientation: Person, Place, Time, Situation Obsessive Compulsive Thoughts/Behaviors: None  Cognitive Functioning Concentration: Fair Memory: Remote Intact, Recent Intact Is patient IDD: No Insight: Fair Impulse Control: Fair Appetite: Fair Have you had any weight changes? : No Change Sleep: Decreased Total Hours of Sleep: 5(Poor over last two days) Vegetative Symptoms: None  ADLScreening [facility]) Patient's cognitive ability adequate to safely complete daily activities?: Yes Patient able to express need for assistance with ADLs?: Yes Independently performs ADLs?: Yes (appropriate for developmental age)  Prior Inpatient Therapy Prior Inpatient Therapy: Yes Prior Therapy Dates: March 2021, 2020 Prior Therapy Facilty/Provider(s): [facility], [facility] Reason for Treatment: Bipolar I  Prior Outpatient Therapy Prior Outpatient Therapy: Yes Prior Therapy Dates: Starting this month Prior Therapy Facilty/Provider(s): [facility] Reason for Treatment: Bipolar I Does patient have an ACCT team?: No Does patient have Intensive In-House Services?  : No Does patient have [facility] services? : No Does patient have P4CC services?: No  ADL Screening (condition at time of admission) Patient's cognitive ability adequate to safely complete daily activities?: Yes Is the patient deaf or have difficulty hearing?: No Does the patient have difficulty seeing, even when wearing glasses/contacts?: No Does the patient have difficulty  concentrating, remembering, or making decisions?: No Patient able to express need for assistance with ADLs?: Yes Does the patient have difficulty dressing or bathing?: No Independently performs ADLs?: Yes (appropriate for developmental age) Does the patient have difficulty walking or climbing stairs?: No Weakness of Legs: None Weakness of Arms/Hands: None  Home Assistive Devices/Equipment Home Assistive Devices/Equipment: None  Therapy Consults (therapy consults require a physician order) PT Evaluation Needed: No OT Evalulation Needed: No SLP Evaluation Needed: No Abuse/Neglect Assessment (Assessment to be complete while patient is alone) Abuse/Neglect Assessment Can Be Completed: Yes Physical Abuse: Denies Verbal Abuse: Denies Sexual Abuse: Denies Exploitation of patient/patient's resources: Denies Self-Neglect: Denies Values / Beliefs Cultural Requests During Hospitalization: None Spiritual Requests During Hospitalization: None Consults Spiritual Care Consult Needed: No Transition of Care Team Consult Needed: No Advance Directives (For Healthcare) Does Patient Have a Medical Advance Directive?: No Would patient like information on creating a medical advance directive?: No - Patient declined          Disposition:  Disposition Initial Assessment Completed for this Encounter: Yes Disposition of Patient: Discharge(Per Nicholos Johns, NP, Pt is psych-cleared)  This service was provided via telemedicine using a 2-way, interactive audio and video technology.  Names of all persons participating in this telemedicine service and their role in this encounter.  Name: Lakeita Bak Role: Patient             Marlowe Aschoff 04/21/2019 9:11 AM

## 2019-04-21 NOTE — Discharge Instructions (Addendum)
Your CT scan and labs were unremarkable   Psychiatrist saw you today and felt that you don't need to be admitted to a psychiatric facility   Continue your current meds   See your doctor and psychiatrist   Return to ER if you have worse abdominal pain, thoughts of harming yourself or others, dehydration

## 2019-04-21 NOTE — ED Notes (Signed)
Patient verbalizes understanding of discharge instructions. Opportunity for questioning and answers were provided. Armband removed by staff, pt discharged from ED to be picked up by her son.

## 2019-04-21 NOTE — ED Notes (Signed)
Checked patient cbg it was 88 notified RN Madison patient is resting with call bell in reach

## 2019-04-21 NOTE — ED Provider Notes (Addendum)
[facility] EMERGENCY DEPARTMENT Provider Note   CSN: [id number] Arrival date & time: 04/20/19  1735     History Chief Complaint  Patient presents with  . Abdominal Cramping  . Diarrhea  . Emesis    Jessica Fisher is a 69 y.o. female.  HPI     This is a 69 year old female with history of COPD, diabetes, hypertension, bipolar disorder who presents with reported diarrhea.  Patient reports over "75 bowel movements" over the last 2 days.  She reports runny green stools.  No blood.  She has had nausea without vomiting.  She reports diffuse "25 out of 10" abdominal pain.  She has not noted any fevers.  She also reports ongoing unchanged chest pain which she has "all the time."  She reports that she was admitted to hospital in [facility] 2 weeks ago for "a mental breakdown" as well as chest pain.  Of note, patient is very pressured in her speech and excitable.  Past Medical History:  Diagnosis Date  . Anxiety   . Benign neoplasm of ascending colon   . Benign neoplasm of cecum   . Benign neoplasm of transverse colon   . Blood clot in vein   . COPD (chronic obstructive pulmonary disease) ([place])   . Diabetes mellitus without complication ([place])   . Hypertension   . Ingrown nail 07/26/2014  . Paronychia 07/26/2014  . Personal history of colonic polyps   . Polyp of sigmoid colon   . Poor venous access 12/07/2014  . Thyroid disease   . Vitamin D deficiency     Patient Active Problem List   Diagnosis Date Noted  . Sick sinus syndrome ([place]) 01/21/2019  . Pacemaker 01/21/2019  . Personal history of colonic polyps   . Benign neoplasm of cecum   . Benign neoplasm of transverse colon   . Polyp of sigmoid colon   . Benign neoplasm of ascending colon   . Problems with swallowing and mastication   . Poor venous access 12/07/2014  . Thyroid disease 12/07/2014  . Ingrown nail 07/26/2014  . Paronychia 07/26/2014    Past Surgical History:  Procedure  Laterality Date  . ABDOMINAL HYSTERECTOMY    . CESAREAN SECTION    . COLONOSCOPY  2015  . COLONOSCOPY WITH PROPOFOL N/A 04/29/2017   Procedure: COLONOSCOPY WITH PROPOFOL;  Surgeon: Lucilla Lame, MD;  Location: [facility] ENDOSCOPY;  Service: Endoscopy;  Laterality: N/A;  . ESOPHAGOGASTRODUODENOSCOPY (EGD) WITH PROPOFOL N/A 04/29/2017   Procedure: ESOPHAGOGASTRODUODENOSCOPY (EGD) WITH PROPOFOL;  Surgeon: Lucilla Lame, MD;  Location: [facility] ENDOSCOPY;  Service: Endoscopy;  Laterality: N/A;  . ESOPHAGOSCOPY WITH DILITATION    . ESOPHAGOSCOPY WITH DILITATION N/A 12/12/2014   Procedure: ESOPHAGOSCOPY WITH DILITATION;  Surgeon: Margaretha Sheffield, MD;  Location: [facility] ORS;  Service: ENT;  Laterality: N/A;  . INSERT / REPLACE / REMOVE PACEMAKER    . PACEMAKER INSERTION  1994  . PHARYNGEAL DILITATION    . PORT-A-CATH REMOVAL    . PORTACATH PLACEMENT    . [place]  . THYROIDECTOMY    . TRACHEOSTOMY  2014  . TUMOR REMOVAL       OB History    Gravida  3   Para      Term      Preterm      AB      Living  3     SAB      TAB      Ectopic  Multiple      Live Births           Obstetric Comments  1st Menstrual Cycle:  13 1st Pregnancy:  17         No family history on file.  Social History   Tobacco Use  . Smoking status: Former Smoker    Packs/day: 1.00    Years: 26.00    Pack years: 26.00    Types: Cigarettes    Quit date: 12/07/1988    Years since quitting: 30.3  . Smokeless tobacco: Never Used  Substance Use Topics  . Alcohol use: No    Alcohol/week: 0.0 standard drinks  . Drug use: No    Home Medications Prior to Admission medications   Medication Sig Start Date End Date Taking? Authorizing Provider  busPIRone (BUSPAR) 30 MG tablet Take 30 mg by mouth 2 (two) times daily.   Yes [provider]  clonazePAM (KLONOPIN) 0.5 MG tablet Take 0.5 mg by mouth 2 (two) times daily as needed for anxiety.   Yes [provider]  digoxin (LANOXIN)  0.125 MG tablet Take 125 mcg by mouth daily. 03/28/19  Yes [provider]  diltiazem (CARDIZEM CD) 300 MG 24 hr capsule Take 300 mg by mouth at bedtime.  02/10/14  Yes [provider]  fluticasone (FLONASE) 50 MCG/ACT nasal spray Place 1 spray into both nostrils daily. 03/25/19  Yes [provider]  insulin regular human CONCENTRATED (HUMULIN R) 500 UNIT/ML injection Inject 50 Units into the skin 3 (three) times daily with meals.  03/01/14  Yes [provider]  isosorbide mononitrate (IMDUR) 60 MG 24 hr tablet Take 60 mg by mouth daily.   Yes [provider]  levothyroxine (SYNTHROID) 125 MCG tablet Take 125 mcg by mouth 2 (two) times daily.  02/10/14  Yes [provider]  linaclotide (LINZESS) 145 MCG CAPS capsule Take 145 mcg by mouth daily before breakfast.   Yes [provider]  mirabegron ER (MYRBETRIQ) 25 MG TB24 tablet Take 25 mg by mouth daily.   Yes [provider]  Morphine Sulfate ER 30 MG T12A Take 30 mg by mouth 2 (two) times daily.    Yes [provider]  nitroGLYCERIN (NITRODUR - DOSED IN MG/24 HR) 0.4 mg/hr patch Place 0.4 mg onto the skin daily.    Yes [provider]  nitroGLYCERIN (NITROSTAT) 0.4 MG SL tablet Place 0.4 mg under the tongue every 5 (five) minutes as needed for chest pain.  03/01/14  Yes [provider]  omeprazole (PRILOSEC) 40 MG capsule Take 40 mg by mouth 2 (two) times daily.  02/10/14  Yes [provider]  Oxycodone HCl 10 MG TABS Take 10 mg by mouth 2 (two) times daily.    Yes [provider]  pregabalin (LYRICA) 300 MG capsule Take 300 mg by mouth 2 (two) times daily.  02/24/14  Yes [provider]  QUEtiapine (SEROQUEL) 50 MG tablet Take 50 mg by mouth at bedtime.  03/01/14  Yes [provider]  rivaroxaban (XARELTO) 20 MG TABS tablet Take 20 mg by mouth daily with supper.  03/14/14  Yes [provider]  rosuvastatin (CRESTOR) 10  MG tablet Take 10 mg by mouth at bedtime.  02/10/14  Yes [provider]  TOUJEO SOLOSTAR 300 UNIT/ML SOPN Inject 50 Units into the skin at bedtime.    Yes [provider]  Insulin Pen Needle (EASY TOUCH PEN NEEDLES) 31G X 8 MM MISC  01/27/14  [provider]  Insulin Syringe-Needle U-100 (BD INSULIN SYRINGE ULTRAFINE) 31G X 5/16" 1 ML MISC  01/27/14   [provider]    Allergies    Lisinopril, Flagyl [metronidazole], Metformin and related, Other, Talwin [pentazocine], Tape, Biaxin [clarithromycin], and Sulfa antibiotics  Review of Systems   Review of Systems  Constitutional: Negative for fever.  Respiratory: Negative for shortness of breath.   Cardiovascular: Positive for chest pain. Negative for leg swelling.  Gastrointestinal: Positive for abdominal pain, diarrhea and nausea. Negative for blood in stool, constipation and vomiting.  Genitourinary: Negative for dysuria.  Psychiatric/Behavioral: Negative for suicidal ideas. The patient is hyperactive.   All other systems reviewed and are negative.   Physical Exam Updated Vital Signs BP (!) 163/81 (BP Location: Right Arm)   Pulse 73   Temp 98.1 F (36.7 C) (Oral)   Resp 20   Ht 1.702 m (5\' 7" )   Wt 80.7 kg   SpO2 100%   BMI 27.88 kg/m   Physical Exam Vitals and nursing note reviewed.  Constitutional:      Appearance: She is well-developed.     Comments: Pressured speech, excitable, no acute distress  HENT:     Head: Normocephalic and atraumatic.     Mouth/Throat:     Mouth: Mucous membranes are moist.  Eyes:     Pupils: Pupils are equal, round, and reactive to light.  Cardiovascular:     Rate and Rhythm: Normal rate and regular rhythm.     Heart sounds: Normal heart sounds.     Comments: Palpable pacemaker Pulmonary:     Effort: Pulmonary effort is normal. No respiratory distress.     Breath sounds: No wheezing.  Abdominal:     General: Bowel sounds are normal.     Palpations:  Abdomen is soft.     Tenderness: There is abdominal tenderness.     Comments: Diffuse tenderness to palpation without rebound or guarding  Musculoskeletal:     Cervical back: Neck supple.     Right lower leg: No edema.     Left lower leg: No edema.  Skin:    General: Skin is warm and dry.  Neurological:     Mental Status: She is alert and oriented to person, place, and time.  Psychiatric:     Comments: Tangential speech, some flight of ideas with pressured speech     ED Results / Procedures / Treatments   Labs (all labs ordered are listed, but only abnormal results are displayed) Labs Reviewed  COMPREHENSIVE METABOLIC PANEL - Abnormal; Notable for the following components:      Result Value   Glucose, Bld 287 (*)    All other components within normal limits  CBC - Abnormal; Notable for the following components:   WBC 10.9 (*)    All other components within normal limits  URINALYSIS, ROUTINE W REFLEX MICROSCOPIC - Abnormal; Notable for the following components:   Specific Gravity, Urine 1.033 (*)    Glucose, UA >=500 (*)    Leukocytes,Ua TRACE (*)    All other components within normal limits  C DIFFICILE QUICK SCREEN W PCR REFLEX  GI PATHOGEN PANEL BY PCR, STOOL  RESPIRATORY PANEL BY RT PCR (FLU A&B, COVID)  LIPASE, BLOOD  RAPID URINE DRUG SCREEN, HOSP PERFORMED  ETHANOL  DIGOXIN LEVEL  TSH  CBG MONITORING, ED    EKG EKG Interpretation  Date/Time:  Tuesday April 20 2019 21:35:04 EDT Ventricular Rate:  76 PR Interval:  140 QRS Duration:  94 QT Interval:  392 QTC Calculation: 441 R Axis:   -50 Text Interpretation:  Poor data quality, interpretation may be adversely affected Normal sinus rhythm Left axis deviation Left ventricular hypertrophy with repolarization abnormality ( R in aVL , Cornell product ) Cannot rule out Septal infarct , age undetermined Abnormal ECG Confirmed by Thayer Jew [phone]) on 04/21/2019 2:13:33 AM   Radiology CT ABDOMEN PELVIS W  CONTRAST  Result Date: 04/21/2019 CLINICAL DATA:  Nausea vomiting abdominal cramping EXAM: CT ABDOMEN AND PELVIS WITH CONTRAST TECHNIQUE: Multidetector CT imaging of the abdomen and pelvis was performed using the standard protocol following bolus administration of intravenous contrast. CONTRAST:  134mL OMNIPAQUE IOHEXOL 300 MG/ML  SOLN COMPARISON:  None. FINDINGS: Lower chest: The visualized heart size within normal limits. No pericardial fluid/thickening. Pacemaker lead tips are seen. No hiatal hernia. The visualized portions of the lungs are clear. Hepatobiliary: The liver is normal in density without focal abnormality.The main portal vein is patent. The patient is status post cholecystectomy. Pancreas: Unremarkable. No pancreatic ductal dilatation or surrounding inflammatory changes. Spleen: Normal in size without focal abnormality. Adrenals/Urinary Tract: There is a 1.9 cm low-density logical seen within the right adrenal gland. A 2 cm low-density lesion seen partially exophytic off the lower pole the left kidney, likely renal cyst. There is also a tiny hypodense lesion within the upper pole the right kidney. No hydronephrosis. Bladder is unremarkable. Stomach/Bowel: The stomach and small bowel are normal in appearance. There is scattered colonic diverticula. There appears to be mild wall thickening seen at the rectum. A mildly prominent air-filled sigmoid rectal junction is noted. Vascular/Lymphatic: There are no enlarged mesenteric, retroperitoneal, or pelvic lymph nodes. There is an infrarenal abdominal IVC filter. Scattered aortic atherosclerotic calcifications are seen without aneurysmal dilatation. Reproductive: The patient is status post hysterectomy. No adnexal masses or collections seen. Other: No evidence of abdominal wall mass or hernia. Musculoskeletal: No acute or significant osseous findings. There is been prior lumbar fixation from L3 through S1. Interbody fusion is noted at L3-L4 and L5-S1.  IMPRESSION: 1. Mild wall thickening seen around the rectum which could be due to mild proctocolitis. 2. Diverticulosis without diverticulitis. 3. 1.9 cm right adrenal gland nodule. This is likely a benign nodule, however if clinically indicated would consider 12 month follow-up to determine stability. This recommendation follows ACR consensus guidelines: Management of Incidental Adrenal Masses: A White Paper of the ACR Incidental Findings Committee. J Am Coll Radiol 2017;14:1038-1044. 4.  Aortic Atherosclerosis (ICD10-I70.0). Electronically Signed   By: Prudencio Pair M.D.   On: 04/21/2019 01:57    Procedures Procedures (including critical care time)  Medications Ordered in ED Medications  busPIRone (BUSPAR) tablet 15 mg (has no administration in time range)  digoxin (LANOXIN) tablet 0.125 mg (has no administration in time range)  diltiazem (CARDIZEM CD) 24 hr capsule 300 mg (has no administration in time range)  insulin glargine (LANTUS) injection 35 Units (has no administration in time range)  insulin aspart (novoLOG) injection 0-15 Units (has no administration in time range)  levothyroxine (SYNTHROID) tablet 250 mcg (has no administration in time range)  QUEtiapine (SEROQUEL XR) 24 hr tablet 50 mg (has no administration in time range)  rivaroxaban (XARELTO) tablet 20 mg (has no administration in time range)  sodium chloride flush (NS) 0.9 % injection 3 mL (3 mLs Intravenous Given 04/21/19 0125)  iohexol (OMNIPAQUE) 300 MG/ML solution 100 mL (100 mLs Intravenous Contrast Given 04/21/19 0135)  droperidol (INAPSINE) 2.5 MG/ML injection 2.5 mg (2.5  mg Intravenous Given 04/21/19 0300)    ED Course  I have reviewed the triage vital signs and the nursing notes.  Pertinent labs & imaging results that were available during my care of the patient were reviewed by me and considered in my medical decision making (see chart for details).    MDM Rules/Calculators/A&P                       Patient  presents with reports of diarrhea and abdominal pain.  Also reports chest pain.  She is overall nontoxic on exam and vital signs are reassuring.  She is very obviously manic with pressured speech and tangential speech.  Unclear whether her somatic complaints are real versus not.  I did review her recent evaluation in [place].  She was admitted to behavioral health with acute mania.  I was only able to view part of her initial H&P.  From a abdominal and chest pain work-up, this is fairly reassuring.  EKG is reassuring without ischemic changes or arrhythmia.  Troponin x2 -.  She has not had any diarrhea while in the emergency room.  CT scan shows possible thickening of the rectum which could be due to mild proctocolitis.  She also has diverticulosis and one adrenal nodule.  Likely not related to her ongoing symptoms.  She was given a dose of droperidol for her symptoms which seem to calm her and alleviate her pain but this recurred.  I feel that primarily her issue is related to acute mania.  We will have her evaluated by TTS.  From a physical standpoint she is medically clear for TTS evaluation.  DIsposition per TTS.  7:33 AM  Spoke to patient son Shanon Brow.  He is unclear of what was done for her in [place] but feels that she has been manic since reliefs over the last week.  She is supposed to follow-up with [facility].  He is unsure whether she is on any medications.  Home Meds ordered Additional dig and tsh orders placed  Final Clinical Impression(s) / ED Diagnoses Final diagnoses:  Manic behavior ([place])  Diarrhea, unspecified type  Adrenal nodule [facility])    Rx / DC Orders ED Discharge Orders    None       Tam Savoia, Barbette Hair, MD 04/21/19 [id number]    Merryl Hacker, MD 04/21/19 [id number]    Merryl Hacker, MD 04/21/19 [phone]

## 2019-04-21 NOTE — ED Provider Notes (Signed)
  Physical Exam  BP (!) 163/78   Pulse 85   Temp 98.1 F (36.7 C) (Oral)   Resp 20   Ht 5\' 7"  (1.702 m)   Wt 80.7 kg   SpO2 95%   BMI 27.88 kg/m   Physical Exam  ED Course/Procedures     Procedures  MDM  Care assumed at 7 am.  Patient here with abdominal pain and vague symptoms.  Patient has a recent admission for psychosis.  Her medical work-up was unremarkable and signout pending psych eval.  11:34 AM Psychiatry saw patient and wrote that patient did not meet inpatient psych criteria.  Stable for discharge.  Patient was given her home dose of pain medicine.       Drenda Freeze, MD 04/21/19 1134

## 2019-05-04 ENCOUNTER — Telehealth: Payer: Self-pay | Admitting: Internal Medicine

## 2019-05-04 NOTE — Telephone Encounter (Signed)
New message   Pt called stating that she needs a new order for her oxygen. She said the information can be sent to [name] [phone] [phone]   Pt states that if she is not at home, leave a message.

## 2019-05-04 NOTE — Telephone Encounter (Signed)
Called patient to let her know she needs to call her PCP for oxygen orders. Patient raised her voice and stated she has already called her PCP and they refused to order her oxygen. Patient stated her PCP told her to call her cardiologist. Asked patient who has been ordering her oxygen, patient stated Dr. Duke Salvia had been ordering. Informed patient that we would send the request to Dr. Johnsie Cancel, and that is the best we could do. Patient sounded outraged and was talking about things that did not concern her oxygen.

## 2019-05-04 NOTE — Telephone Encounter (Signed)
Referral was placed and patient had an appointment in December with Dr. Elsworth Soho  which was cancelled. Will reach out to [facility] for appointment.

## 2019-05-04 NOTE — Telephone Encounter (Signed)
She was supposed to be referred to pulmonary can see [facility] and they can order her oxygen for COPD

## 2019-05-05 NOTE — Telephone Encounter (Signed)
Patient refusing to see pulmonologist at [facility]. Informed patient that she needs to see a pulmonologist in order to have someone follow and order her oxygen. Patient stated she has a pulmonologist in [facility], encouraged patient to reach out to them. Patient stated that office is closed due to covid. Patient stated she would just see the PA on the 28th and she would get her oxygen then. Will keep Pulmonology appointment for now, in case patient changes her mind.

## 2019-05-25 NOTE — Progress Notes (Signed)
Electrophysiology Office Note Date: 05/26/2019  ID:  Jessica Fisher, Jessica Fisher Jan 30, 1950, MRN [id number]  PCP: Jessica Spanish, MD Primary Cardiologist: No primary care provider on file. Electrophysiologist: Cristopher Peru, MD   CC: Pacemaker follow-up  Jessica Fisher is a 69 y.o. female seen today for Cristopher Peru, MD for routine electrophysiology followup.  Since last being seen in our clinic the patient reports doing poorly. She has chronic pulmonary emboli by CTA 07/2017 and feels like they're getting worse. She has flights of thought and pressured speech. Very difficult historian.  She has chronic chest tenderness. She states she has been on oxygen for "20 something years" and recently had it taken away. She states she has had over 700 loose stools since 04/18/19.   Device History: StMudlogger PPM implanted 1995, ? gen change 2003, ? RA lead revision 2003, most recent gen change 2016 for Sinus node dysfunction.  Past Medical History:  Diagnosis Date  . Anxiety   . Benign neoplasm of ascending colon   . Benign neoplasm of cecum   . Benign neoplasm of transverse colon   . Blood clot in vein   . COPD (chronic obstructive pulmonary disease) ([place])   . Diabetes mellitus without complication ([place])   . Hypertension   . Ingrown nail 07/26/2014  . Paronychia 07/26/2014  . Personal history of colonic polyps   . Polyp of sigmoid colon   . Poor venous access 12/07/2014  . Thyroid disease   . Vitamin D deficiency    Past Surgical History:  Procedure Laterality Date  . ABDOMINAL HYSTERECTOMY    . CESAREAN SECTION    . COLONOSCOPY  2015  . COLONOSCOPY WITH PROPOFOL N/A 04/29/2017   Procedure: COLONOSCOPY WITH PROPOFOL;  Surgeon: Lucilla Lame, MD;  Location: [facility] ENDOSCOPY;  Service: Endoscopy;  Laterality: N/A;  . ESOPHAGOGASTRODUODENOSCOPY (EGD) WITH PROPOFOL N/A 04/29/2017   Procedure: ESOPHAGOGASTRODUODENOSCOPY (EGD) WITH PROPOFOL;  Surgeon: Lucilla Lame, MD;  Location: [facility]  ENDOSCOPY;  Service: Endoscopy;  Laterality: N/A;  . ESOPHAGOSCOPY WITH DILITATION    . ESOPHAGOSCOPY WITH DILITATION N/A 12/12/2014   Procedure: ESOPHAGOSCOPY WITH DILITATION;  Surgeon: Margaretha Sheffield, MD;  Location: [facility] ORS;  Service: ENT;  Laterality: N/A;  . INSERT / REPLACE / REMOVE PACEMAKER    . PACEMAKER INSERTION  1994  . PHARYNGEAL DILITATION    . PORT-A-CATH REMOVAL    . PORTACATH PLACEMENT    . [place]  . THYROIDECTOMY    . TRACHEOSTOMY  2014  . TUMOR REMOVAL      Current Outpatient Medications  Medication Sig Dispense Refill  . busPIRone (BUSPAR) 30 MG tablet Take 30 mg by mouth 2 (two) times daily.    . clonazePAM (KLONOPIN) 0.5 MG tablet Take 0.5 mg by mouth 2 (two) times daily as needed for anxiety.    . digoxin (LANOXIN) 0.125 MG tablet Take 125 mcg by mouth daily.    Marland Kitchen diltiazem (CARDIZEM CD) 300 MG 24 hr capsule Take 300 mg by mouth at bedtime.     . fluticasone (FLONASE) 50 MCG/ACT nasal spray Place 1 spray into both nostrils daily.    . Insulin Pen Needle (EASY TOUCH PEN NEEDLES) 31G X 8 MM MISC     . insulin regular human CONCENTRATED (HUMULIN R) 500 UNIT/ML injection Inject 50 Units into the skin 3 (three) times daily with meals.     . Insulin Syringe-Needle U-100 (BD INSULIN SYRINGE ULTRAFINE) 31G X 5/16" 1 ML MISC     .  isosorbide mononitrate (IMDUR) 60 MG 24 hr tablet Take 60 mg by mouth daily.    Marland Kitchen levothyroxine (SYNTHROID) 125 MCG tablet Take 125 mcg by mouth 2 (two) times daily.     Marland Kitchen linaclotide (LINZESS) 145 MCG CAPS capsule Take 145 mcg by mouth daily before breakfast.    . mirabegron ER (MYRBETRIQ) 25 MG TB24 tablet Take 25 mg by mouth daily.    . Morphine Sulfate ER 30 MG T12A Take 30 mg by mouth 2 (two) times daily.     . nitroGLYCERIN (NITRODUR - DOSED IN MG/24 HR) 0.4 mg/hr patch Place 0.4 mg onto the skin daily.     . nitroGLYCERIN (NITROSTAT) 0.4 MG SL tablet Place 0.4 mg under the tongue every 5 (five) minutes as needed for chest pain.      Marland Kitchen omeprazole (PRILOSEC) 40 MG capsule Take 40 mg by mouth 2 (two) times daily.     . Oxycodone HCl 10 MG TABS Take 10 mg by mouth 2 (two) times daily.     . pregabalin (LYRICA) 300 MG capsule Take 300 mg by mouth 2 (two) times daily.     . QUEtiapine (SEROQUEL) 50 MG tablet Take 50 mg by mouth at bedtime.     . rivaroxaban (XARELTO) 20 MG TABS tablet Take 20 mg by mouth daily with supper.     . rosuvastatin (CRESTOR) 10 MG tablet Take 10 mg by mouth at bedtime.     Nelva Nay SOLOSTAR 300 UNIT/ML SOPN Inject 50 Units into the skin at bedtime.      No current facility-administered medications for this visit.    Allergies:   Lisinopril, Flagyl [metronidazole], Metformin and related, Other, Talwin [pentazocine], Tape, Biaxin [clarithromycin], and Sulfa antibiotics   Social History: Social History   Socioeconomic History  . Marital status: Widowed    Spouse name: Not on file  . Number of children: Not on file  . Years of education: Not on file  . Highest education level: Not on file  Occupational History  . Not on file  Tobacco Use  . Smoking status: Former Smoker    Packs/day: 1.00    Years: 26.00    Pack years: 26.00    Types: Cigarettes    Quit date: 12/07/1988    Years since quitting: 30.4  . Smokeless tobacco: Never Used  Substance and Sexual Activity  . Alcohol use: No    Alcohol/week: 0.0 standard drinks  . Drug use: No  . Sexual activity: Not on file  Other Topics Concern  . Not on file  Social History Narrative  . Not on file   Social Determinants of Health   Financial Resource Strain:   . Difficulty of Paying Living Expenses:   Food Insecurity:   . Worried About Charity fundraiser in the Last Year:   . Arboriculturist in the Last Year:   Transportation Needs:   . Film/video editor (Medical):   Marland Kitchen Lack of Transportation (Non-Medical):   Physical Activity:   . Days of Exercise per Week:   . Minutes of Exercise per Session:   Stress:   . Feeling of  Stress :   Social Connections:   . Frequency of Communication with Friends and Family:   . Frequency of Social Gatherings with Friends and Family:   . Attends Religious Services:   . Active Member of Clubs or Organizations:   . Attends Archivist Meetings:   Marland Kitchen Marital Status:   Intimate  Partner Violence:   . Fear of Current or Ex-Partner:   . Emotionally Abused:   Marland Kitchen Physically Abused:   . Sexually Abused:     Family History: History reviewed. No pertinent family history.   Review of Systems: All other systems reviewed and are otherwise negative except as noted above.  Physical Exam: Vitals:   05/26/19 1240  BP: 138/72  Pulse: 79  SpO2: 97%  Weight: 203 lb (92.1 kg)  Height: 5\' 7"  (1.702 m)     GEN- The patient is well appearing, alert and oriented x 3 today.   HEENT: normocephalic, atraumatic; sclera clear, conjunctiva pink; hearing intact; oropharynx clear; neck supple  Lungs- Clear to ausculation bilaterally, normal work of breathing.  No wheezes, rales, rhonchi Heart- Regular rate and rhythm, no murmurs, rubs or gallops  GI- soft, non-tender, non-distended, bowel sounds present  Extremities- no clubbing, cyanosis, or edema  MS- no significant deformity or atrophy Skin- warm and dry, no rash or lesion; PPM pocket well healed Psych- euthymic mood, full affect Neuro- strength and sensation are intact  PPM Interrogation- reviewed in detail today,  See PACEART report  EKG:  EKG is not ordered today. The ekg ordered 04/23/2019 shows NSR at 76 bpm, normal intervals  Recent Labs: 04/20/2019: ALT 26; BUN 11; Creatinine, Ser 0.91; Hemoglobin 14.8; Platelets 261; Potassium 3.9; Sodium 136 04/21/2019: TSH 2.530   Wt Readings from Last 3 Encounters:  05/26/19 203 lb (92.1 kg)  04/20/19 178 lb (80.7 kg)  01/21/19 213 lb 3.2 oz (96.7 kg)     Other studies Reviewed: Additional studies/ records that were reviewed today include: Previous EP office notes, Previous  remote checks, Most recent labwork.   Assessment and Plan:  1. SND s/p St. Jude PPM  Normal PPM function See Pace Art report No changes today  2. PAF Burden < 1% Continue current medications  3. Chronic hypoxic respiratory failure Pt did not desaturate walking from front, to scale, to room. Lowest pulse ox 95%. Pt has follow up with pulmonary to establish next month. Encouraged to keep.   4. Bipolar disorder Poorly controlled by history today. Very difficult historian.  Encouraged regular follow up with PCP and her Education officer, museum.  Current medicines are reviewed at length with the patient today.   The patient does not have concerns regarding her medicines.  The following changes were made today:  none  Labs/ tests ordered today include:  Orders Placed This Encounter  Procedures  . CUP PACEART INCLINIC DEVICE CHECK    Disposition:   Follow up with Dr. Lovena Le in 9 months.   Jacalyn Lefevre, PA-C  05/26/2019 1:50 PM  [place] [address] [phone] (office) [phone] (fax)

## 2019-05-26 ENCOUNTER — Ambulatory Visit: Payer: Medicare Other | Admitting: Student

## 2019-05-26 ENCOUNTER — Telehealth: Payer: Self-pay

## 2019-05-26 ENCOUNTER — Other Ambulatory Visit: Payer: Self-pay

## 2019-05-26 ENCOUNTER — Encounter: Payer: Self-pay | Admitting: Student

## 2019-05-26 VITALS — BP 138/72 | HR 79 | Ht 67.0 in | Wt 203.0 lb

## 2019-05-26 DIAGNOSIS — J449 Chronic obstructive pulmonary disease, unspecified: Secondary | ICD-10-CM

## 2019-05-26 DIAGNOSIS — I495 Sick sinus syndrome: Secondary | ICD-10-CM | POA: Diagnosis not present

## 2019-05-26 DIAGNOSIS — Z950 Presence of cardiac pacemaker: Secondary | ICD-10-CM | POA: Diagnosis not present

## 2019-05-26 DIAGNOSIS — I480 Paroxysmal atrial fibrillation: Secondary | ICD-10-CM | POA: Diagnosis not present

## 2019-05-26 LAB — CUP PACEART INCLINIC DEVICE CHECK
Battery Remaining Longevity: 122 mo
Battery Voltage: 2.98 V
Brady Statistic RA Percent Paced: 59 %
Brady Statistic RV Percent Paced: 0.46 %
Date Time Interrogation Session: 20210428132918
Implantable Lead Implant Date: 20160427
Implantable Lead Implant Date: 20160427
Implantable Lead Location: 753859
Implantable Lead Location: 753860
Implantable Lead Model: 5076
Implantable Pulse Generator Implant Date: 20160427
Lead Channel Impedance Value: 462.5 Ohm
Lead Channel Impedance Value: 525 Ohm
Lead Channel Pacing Threshold Amplitude: 1 V
Lead Channel Pacing Threshold Amplitude: 1 V
Lead Channel Pacing Threshold Amplitude: 1.25 V
Lead Channel Pacing Threshold Amplitude: 1.25 V
Lead Channel Pacing Threshold Pulse Width: 0.4 ms
Lead Channel Pacing Threshold Pulse Width: 0.4 ms
Lead Channel Pacing Threshold Pulse Width: 0.5 ms
Lead Channel Pacing Threshold Pulse Width: 0.5 ms
Lead Channel Sensing Intrinsic Amplitude: 1.4 mV
Lead Channel Sensing Intrinsic Amplitude: 8.3 mV
Lead Channel Setting Pacing Amplitude: 2.5 V
Lead Channel Setting Pacing Amplitude: 2.5 V
Lead Channel Setting Pacing Pulse Width: 0.5 ms
Lead Channel Setting Sensing Sensitivity: 2 mV
Pulse Gen Model: 2240
Pulse Gen Serial Number: 7763893

## 2019-05-26 NOTE — Telephone Encounter (Signed)
I spoke to the patient and informed her that she may arrive earlier for her 12:35 appointment.  She verbalized understanding.

## 2019-05-26 NOTE — Patient Instructions (Addendum)
Medication Instructions:  none *If you need a refill on your cardiac medications before your next appointment, please call your pharmacy*   Lab Work: none If you have labs (blood work) drawn today and your tests are completely normal, you will receive your results only by: Marland Kitchen MyChart Message (if you have MyChart) OR . A paper copy in the mail If you have any lab test that is abnormal or we need to change your treatment, we will call you to review the results.   Testing/Procedures: none   Follow-Up: At [facility], you and your health needs are our priority.  As part of our continuing mission to provide you with exceptional heart care, we have created designated Provider Care Teams.  These Care Teams include your primary Cardiologist (physician) and Advanced Practice Providers (APPs -  Physician Assistants and Nurse Practitioners) who all work together to provide you with the care you need, when you need it.  We recommend signing up for the patient portal called "MyChart".  Sign up information is provided on this After Visit Summary.  MyChart is used to connect with patients for Virtual Visits (Telemedicine).  Patients are able to view lab/test results, encounter notes, upcoming appointments, etc.  Non-urgent messages can be sent to your provider as well.   To learn more about what you can do with MyChart, go to [email or web address].    Your next appointment:   9 months  The format for your next appointment:   Either In Person or Virtual  Provider:   Dr Lovena Le   Other Instructions Remote monitoring is used to monitor your Pacemaker from home. This monitoring reduces the number of office visits required to check your device to one time per year. It allows [place] to keep an eye on the functioning of your device to ensure it is working properly. You are scheduled for a device check from home on 06/23/19. You may send your transmission at any time that day. If you have a wireless device,  the transmission will be sent automatically. After your physician reviews your transmission, you will receive a postcard with your next transmission date.

## 2019-06-14 ENCOUNTER — Institutional Professional Consult (permissible substitution): Payer: Medicare Other | Admitting: Pulmonary Disease

## 2019-06-24 ENCOUNTER — Telehealth: Payer: Self-pay

## 2019-06-24 NOTE — Telephone Encounter (Signed)
Left message for patient to remind of missed remote transmission.  

## 2019-06-29 ENCOUNTER — Ambulatory Visit (INDEPENDENT_AMBULATORY_CARE_PROVIDER_SITE_OTHER): Payer: Medicare Other | Admitting: *Deleted

## 2019-06-29 DIAGNOSIS — I495 Sick sinus syndrome: Secondary | ICD-10-CM

## 2019-06-30 LAB — CUP PACEART REMOTE DEVICE CHECK
Battery Remaining Longevity: 108 mo
Battery Remaining Percentage: 95.5 %
Battery Voltage: 2.98 V
Brady Statistic AP VP Percent: 1 %
Brady Statistic AP VS Percent: 80 %
Brady Statistic AS VP Percent: 1 %
Brady Statistic AS VS Percent: 19 %
Brady Statistic RA Percent Paced: 80 %
Brady Statistic RV Percent Paced: 1 %
Date Time Interrogation Session: 20210601155050
Implantable Lead Implant Date: 20160427
Implantable Lead Implant Date: 20160427
Implantable Lead Location: 753859
Implantable Lead Location: 753860
Implantable Lead Model: 5076
Implantable Pulse Generator Implant Date: 20160427
Lead Channel Impedance Value: 490 Ohm
Lead Channel Impedance Value: 510 Ohm
Lead Channel Pacing Threshold Amplitude: 1 V
Lead Channel Pacing Threshold Amplitude: 1.25 V
Lead Channel Pacing Threshold Pulse Width: 0.4 ms
Lead Channel Pacing Threshold Pulse Width: 0.5 ms
Lead Channel Sensing Intrinsic Amplitude: 0.5 mV
Lead Channel Sensing Intrinsic Amplitude: 6.6 mV
Lead Channel Setting Pacing Amplitude: 2.5 V
Lead Channel Setting Pacing Amplitude: 2.5 V
Lead Channel Setting Pacing Pulse Width: 0.5 ms
Lead Channel Setting Sensing Sensitivity: 2 mV
Pulse Gen Model: 2240
Pulse Gen Serial Number: 7763893

## 2019-06-30 NOTE — Progress Notes (Signed)
Remote pacemaker transmission.   

## 2019-07-07 ENCOUNTER — Institutional Professional Consult (permissible substitution): Payer: Medicare Other | Admitting: Critical Care Medicine

## 2019-09-28 ENCOUNTER — Ambulatory Visit (INDEPENDENT_AMBULATORY_CARE_PROVIDER_SITE_OTHER): Payer: Medicare Other | Admitting: *Deleted

## 2019-09-28 DIAGNOSIS — I495 Sick sinus syndrome: Secondary | ICD-10-CM

## 2019-09-28 LAB — CUP PACEART REMOTE DEVICE CHECK
Battery Remaining Longevity: 107 mo
Battery Remaining Percentage: 95.5 %
Battery Voltage: 2.98 V
Brady Statistic AP VP Percent: 1 %
Brady Statistic AP VS Percent: 82 %
Brady Statistic AS VP Percent: 1 %
Brady Statistic AS VS Percent: 17 %
Brady Statistic RA Percent Paced: 81 %
Brady Statistic RV Percent Paced: 1 %
Date Time Interrogation Session: 20210831023634
Implantable Lead Implant Date: 20160427
Implantable Lead Implant Date: 20160427
Implantable Lead Location: 753859
Implantable Lead Location: 753860
Implantable Lead Model: 5076
Implantable Pulse Generator Implant Date: 20160427
Lead Channel Impedance Value: 460 Ohm
Lead Channel Impedance Value: 530 Ohm
Lead Channel Pacing Threshold Amplitude: 1 V
Lead Channel Pacing Threshold Amplitude: 1.25 V
Lead Channel Pacing Threshold Pulse Width: 0.4 ms
Lead Channel Pacing Threshold Pulse Width: 0.5 ms
Lead Channel Sensing Intrinsic Amplitude: 1.7 mV
Lead Channel Sensing Intrinsic Amplitude: 6.9 mV
Lead Channel Setting Pacing Amplitude: 2.5 V
Lead Channel Setting Pacing Amplitude: 2.5 V
Lead Channel Setting Pacing Pulse Width: 0.5 ms
Lead Channel Setting Sensing Sensitivity: 2 mV
Pulse Gen Model: 2240
Pulse Gen Serial Number: 7763893

## 2019-09-29 NOTE — Progress Notes (Signed)
Remote pacemaker transmission.   

## 2022-06-25 ENCOUNTER — Ambulatory Visit: Payer: Medicare Other | Attending: Internal Medicine
